# Patient Record
Sex: Female | Born: 1945 | Race: White | Hispanic: No | Smoking: Current every day smoker
Health system: Southern US, Community
[De-identification: ages and names within clinical notes are randomized; demographics above are authoritative.]

## PROBLEM LIST (undated history)

## (undated) DIAGNOSIS — F419 Anxiety disorder, unspecified: Secondary | ICD-10-CM

## (undated) HISTORY — DX: Anxiety disorder, unspecified: F41.9

## (undated) HISTORY — PX: APPENDECTOMY: SHX54

---

## 2012-02-16 ENCOUNTER — Ambulatory Visit (INDEPENDENT_AMBULATORY_CARE_PROVIDER_SITE_OTHER): Payer: BC Managed Care – PPO | Admitting: Family Medicine

## 2012-02-16 VITALS — BP 154/85 | HR 111 | Temp 97.9°F | Resp 18 | Ht 63.5 in | Wt 129.0 lb

## 2012-02-16 DIAGNOSIS — F419 Anxiety disorder, unspecified: Secondary | ICD-10-CM

## 2012-02-16 DIAGNOSIS — J069 Acute upper respiratory infection, unspecified: Secondary | ICD-10-CM

## 2012-02-16 DIAGNOSIS — F411 Generalized anxiety disorder: Secondary | ICD-10-CM

## 2012-02-16 MED ORDER — MUCINEX DM 30-600 MG PO TB12: 1.0000 | ORAL_TABLET | Freq: Two times a day (BID) | ORAL | Status: DC

## 2012-02-16 MED ORDER — PAROXETINE HCL 40 MG PO TABS: 40.0000 mg | ORAL_TABLET | ORAL | Status: DC

## 2012-02-16 MED ORDER — AMOXICILLIN-POT CLAVULANATE 875-125 MG PO TABS: 1.0000 | ORAL_TABLET | Freq: Two times a day (BID) | ORAL | Status: DC

## 2012-02-16 MED ORDER — IPRATROPIUM BROMIDE 0.03 % NA SOLN: 2.0000 | Freq: Two times a day (BID) | NASAL | Status: DC

## 2012-02-16 NOTE — Progress Notes (Signed)
[address]   [place], [place]  [address]   [id number]  Subjective:    Patient ID: Holly Farmer, female    DOB: 28-Oct-1945, 67 y.o.   MRN: [id number]  HPIThis 67 y.o. female presents for evaluation of the following:    1.  Cold symptoms: onset four days ago.  Spent 14 hours in Emergency Department at [facility].  No fever; +fever blister.  +nasal congestion.  Hoarseness.  No chills/sweats. No headache.  No ear pain; scratchy throat mild sore throat. S/p flu vaccine.  +coughing; mild sputum production; +PND: +rhinorrhea.  No v/d.  No body aches.  Took Aleve this morning.  +tobacco abuse sporadic.    2. Anxiety: needs refill on Paxil.  Just moved 73 year old mother from [place].  Taking for two years; life is stressful.  Planned wedding for daughter in July 2013.  Ran out of Paxil one month ago.  Dizzy for a while.  Sleeping well.  No SI/HI.  Daughter is therapist; no therapist formally.    PCP:  None; previously treated by Dr. Yetta Barre; last visit four months ago.  Looking for new PCP.     Review of Systems  Constitutional: Negative for fever, chills, diaphoresis and fatigue.  HENT: Positive for congestion, rhinorrhea, voice change and postnasal drip. Negative for ear pain, sore throat, trouble swallowing and sinus pressure.   Respiratory: Positive for cough. Negative for shortness of breath and wheezing.   Gastrointestinal: Negative for nausea, vomiting and diarrhea.  Skin: Negative for rash.  Psychiatric/Behavioral: Negative for suicidal ideas, sleep disturbance, self-injury and dysphoric mood. The patient is nervous/anxious.         Past Medical History  Diagnosis Date  . Anxiety   . Stress     Past Surgical History  Procedure Date  . Appendectomy     Prior to Admission medications   Medication Sig Start Date End Date Taking? Authorizing Provider  amoxicillin-clavulanate (AUGMENTIN) 875-125 MG per tablet Take 1 tablet by mouth 2 (two) times daily. 02/16/12   [name],  MD  Dextromethorphan-Guaifenesin (MUCINEX DM) 30-600 MG TB12 Take 1 tablet by mouth 2 (two) times daily. 02/16/12   [name], MD  ipratropium (ATROVENT) 0.03 % nasal spray Place 2 sprays into the nose 2 (two) times daily. 02/16/12   [name], MD  PARoxetine (PAXIL) 40 MG tablet Take 1 tablet (40 mg total) by mouth every morning. 02/16/12   [name], MD    No Known Allergies  History   Social History  . Marital Status: Unknown    Spouse Name: N/A    Number of Children: N/A  . Years of Education: N/A   Occupational History  . Not on file.   Social History Main Topics  . Smoking status: Current Every Day Smoker  . Smokeless tobacco: Not on file  . Alcohol Use: Yes  . Drug Use: Not on file  . Sexually Active: Not on file   Other Topics Concern  . Not on file   Social History Narrative   Marital status:  Married   Children: 2 children; no grandchildren.   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   Tobacco: daily smoker    Alcohol: socially.; once per month   Exercise: no formal exercise.    Family History  Problem Relation Age of Onset  . Atrial fibrillation Mother   . Arthritis Mother     Objective:   Physical Exam  Nursing  note and vitals reviewed. Constitutional: She is oriented to person, place, and time. She appears well-developed and well-nourished. No distress.  HENT:  Head: Normocephalic and atraumatic.  Left Ear: External ear normal.  Nose: Rhinorrhea present. Right sinus exhibits no maxillary sinus tenderness and no frontal sinus tenderness. Left sinus exhibits no maxillary sinus tenderness and no frontal sinus tenderness.  Mouth/Throat: Posterior oropharyngeal erythema present. No oropharyngeal exudate or posterior oropharyngeal edema.  Eyes: Conjunctivae normal are normal. Pupils are equal, round, and reactive to light.  Neck: Normal range of motion. Neck supple.  Cardiovascular: Normal rate and regular rhythm.   Pulmonary/Chest:  Breath sounds normal.  Lymphadenopathy:    She has cervical adenopathy.  Neurological: She is alert and oriented to person, place, and time. No cranial nerve deficit. She exhibits normal muscle tone. Coordination normal.  Skin: She is not diaphoretic.  Psychiatric: Her behavior is normal. Judgment and thought content normal. Her mood appears anxious.       Assessment & Plan:   1. Acute upper respiratory infections of unspecified site  Dextromethorphan-Guaifenesin (MUCINEX DM) 30-600 MG TB12, ipratropium (ATROVENT) 0.03 % nasal spray, amoxicillin-clavulanate (AUGMENTIN) 875-125 MG per tablet  2. Anxiety  PARoxetine (PAXIL) 40 MG tablet     1. URI:   New.  Rx for Mucinex DM bid; rx for Atrovent nasal spray. If no improvement in 3-5 days, start Augmentin bid. 2.  Anxiety:  New to this provider; chronic per patient; refill of Paxil 40mg  daily.  Recommend follow-up in six months.  Meds ordered this encounter  Medications  . DISCONTD: PARoxetine (PAXIL) 40 MG tablet    Sig: Take 40 mg by mouth every morning.  Marland Kitchen PARoxetine (PAXIL) 40 MG tablet    Sig: Take 1 tablet (40 mg total) by mouth every morning.    Dispense:  30 tablet    Refill:  5  . Dextromethorphan-Guaifenesin (MUCINEX DM) 30-600 MG TB12    Sig: Take 1 tablet by mouth 2 (two) times daily.    Dispense:  28 each    Refill:  0  . ipratropium (ATROVENT) 0.03 % nasal spray    Sig: Place 2 sprays into the nose 2 (two) times daily.    Dispense:  30 mL    Refill:  5  . amoxicillin-clavulanate (AUGMENTIN) 875-125 MG per tablet    Sig: Take 1 tablet by mouth 2 (two) times daily.    Dispense:  20 tablet    Refill:  0

## 2012-02-16 NOTE — Patient Instructions (Addendum)
1. Acute upper respiratory infections of unspecified site  Dextromethorphan-Guaifenesin (MUCINEX DM) 30-600 MG TB12, ipratropium (ATROVENT) 0.03 % nasal spray, amoxicillin-clavulanate (AUGMENTIN) 875-125 MG per tablet  2. Anxiety  PARoxetine (PAXIL) 40 MG tablet     1. START MUCINEX DM AND ATROVENT NASAL SPRAY; IF NO IMPROVEMENT IN 3-5 DAYS, START AUGMENTIN FOR SINUS INFECTION.

## 2012-09-06 ENCOUNTER — Other Ambulatory Visit: Payer: Self-pay | Admitting: Family Medicine

## 2012-10-20 ENCOUNTER — Other Ambulatory Visit: Payer: Self-pay | Admitting: Physician Assistant

## 2012-10-20 ENCOUNTER — Other Ambulatory Visit: Payer: Self-pay | Admitting: Family Medicine

## 2012-10-20 NOTE — Telephone Encounter (Signed)
PT IS SCHEDULED TO SEE DR Katrinka Blazing ON 10/24/12, BUT IS REQUESTING to get 4 pills until her appt

## 2012-10-24 ENCOUNTER — Ambulatory Visit (INDEPENDENT_AMBULATORY_CARE_PROVIDER_SITE_OTHER): Payer: BC Managed Care – PPO | Admitting: Family Medicine

## 2012-10-24 ENCOUNTER — Encounter: Payer: Self-pay | Admitting: Family Medicine

## 2012-10-24 VITALS — BP 134/78 | HR 87 | Temp 98.1°F | Resp 16 | Ht 63.5 in | Wt 129.2 lb

## 2012-10-24 DIAGNOSIS — F411 Generalized anxiety disorder: Secondary | ICD-10-CM

## 2012-10-24 MED ORDER — PAROXETINE HCL 40 MG PO TABS: 40.0000 mg | ORAL_TABLET | ORAL | Status: DC

## 2012-10-24 NOTE — Progress Notes (Signed)
Subjective:    Patient ID: Holly Farmer, female    DOB: 12-Aug-1945, 67 y.o.   MRN: [id number]  HPI Last CPE- unknown Colonoscopy- never, not interested Mammo/pap- not regular Flu- every year at [facility], discussed, will think about it  Doing the same. Mom moved here, going better than she thought it would. A lot of work. Mother lives in an apartment, but wants to move in with her.  Emotionally handling everything well. Needs a vacation. Family and work keeping her busy. Paxil takes edge off anxiety. Current dose working well. Ran out of paxil- felt dizzy. On Paxil 3-4 years. Thinks she is on a good level. No major mood swings.  Goes to [employer] [facility] for kayaking every month. Works hard for 3 weeks to have a week off.  Sleeps very well.   Married 36 years, happily married, no abuse. Daughter and son, no grandchildren.  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  Smokes less than ppd. ETOH- 1x week Drugs- no Exercise- kayaking, moving furniture.Occasional headache relieved with Aleve. Appendectomy age 86  Mother- 81, takes Paxil Father- died in plane crash when she was young No siblings   Review of Systems  Constitutional: Negative for fever, chills, diaphoresis and fatigue.  Neurological: Negative for dizziness, tremors, speech difficulty, light-headedness, numbness and headaches.  Psychiatric/Behavioral: Negative for suicidal ideas, sleep disturbance, self-injury, dysphoric mood and decreased concentration. The patient is nervous/anxious.    Past Medical History  Diagnosis Date  . Anxiety    Past Surgical History  Procedure Laterality Date  . Appendectomy     No Known Allergies Current Outpatient Prescriptions on File Prior to Visit  Medication Sig Dispense Refill  . amoxicillin-clavulanate (AUGMENTIN) 875-125 MG per tablet Take 1 tablet by mouth 2 (two) times daily.  20 tablet  0  . Dextromethorphan-Guaifenesin (MUCINEX DM) 30-600 MG TB12 Take 1  tablet by mouth 2 (two) times daily.  28 each  0  . ipratropium (ATROVENT) 0.03 % nasal spray Place 2 sprays into the nose 2 (two) times daily.  30 mL  5   No current facility-administered medications on file prior to visit.   History   Social History  . Marital Status: Unknown    Spouse Name: N/A    Number of Children: 2  . Years of Education: N/A   Occupational History  . 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 Smoking status: Current Every Day Smoker  . Smokeless tobacco: Never Used  . Alcohol Use: 1.2 oz/week    2 Glasses of wine per week  . Drug Use: No  . Sexual Activity: Yes   Other Topics Concern  . Not on file   Social History Narrative   Marital status:  Marriedx 36 years. Happily married; no abuse.      Children: 2 children; no grandchildren.      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      Tobacco: daily smoker less than 1ppd.       Alcohol: socially.; once per month      Drugs: none      Exercise: no formal exercise.  Kayaking once per month; moves furniture a lot at work.            Family History  Problem Relation Age of Onset  . Atrial fibrillation Mother   . Arthritis Mother        Objective:  Physical Exam  Nursing note and vitals reviewed. Constitutional: She is oriented to person, place, and time. She appears well-developed and well-nourished. No distress.  HENT:  Head: Normocephalic and atraumatic.  Eyes: Conjunctivae are normal. Pupils are equal, round, and reactive to light.  Neck: Normal range of motion. Neck supple.  Cardiovascular: Normal rate, regular rhythm and normal heart sounds.  Exam reveals no gallop and no friction rub.   No murmur heard. Pulmonary/Chest: Effort normal and breath sounds normal. She has no wheezes. She has no rales.  Neurological: She is alert and oriented to person, place, and time.  Skin: She is not diaphoretic.  Psychiatric: She has a normal  mood and affect. Her behavior is normal.      Assessment & Plan:  Generalized anxiety disorder  1. Generalized Anxiety D/o: controlled; refill of Paxil provided.  Follow-up six months. 2.  Health Maintenance: pt declined appointment for CPE with pap smear, mammogram, colonoscopy, immunizations, etc.  Counseled on current guidelines for preventative health.  Meds ordered this encounter  Medications  . PARoxetine (PAXIL) 40 MG tablet    Sig: Take 1 tablet (40 mg total) by mouth every morning.    Dispense:  30 tablet    Refill:  5   Nilda Simmer, M.D.  [facility] [address] [place], [place]  [address] [phone] phone [phone] fax

## 2012-12-05 ENCOUNTER — Encounter: Payer: Self-pay | Admitting: Family Medicine

## 2013-04-05 ENCOUNTER — Encounter: Payer: Self-pay | Admitting: Family Medicine

## 2013-05-14 ENCOUNTER — Other Ambulatory Visit: Payer: Self-pay | Admitting: Family Medicine

## 2013-07-25 ENCOUNTER — Telehealth: Payer: Self-pay

## 2013-07-25 MED ORDER — PAROXETINE HCL 40 MG PO TABS: ORAL_TABLET | ORAL | Status: DC

## 2013-07-25 NOTE — Telephone Encounter (Signed)
Pt.notified

## 2013-07-25 NOTE — Telephone Encounter (Signed)
Pended the order, patient due for visit. Has appt scheduled for later this month.

## 2013-07-25 NOTE — Telephone Encounter (Signed)
PARoxetine (PAXIL) 40 MG tablet [[id number]]  Patient has schedule a visit with Dr. Katrinka BlazingSmith on 08/13/2013 for medication refill.   She is out of medicine as of 07/25/2013 and gets dizzy when not taken.  Pt wanted to know if medication can be called in until she has her appt on 08/13/2013. Phone - [phone]

## 2013-07-25 NOTE — Telephone Encounter (Signed)
Rx for Paxil sent to pharmacy.  Thanks.

## 2013-08-13 ENCOUNTER — Ambulatory Visit: Payer: BC Managed Care – PPO | Admitting: Family Medicine

## 2013-08-22 ENCOUNTER — Encounter: Payer: Self-pay | Admitting: Family Medicine

## 2013-08-22 ENCOUNTER — Ambulatory Visit (INDEPENDENT_AMBULATORY_CARE_PROVIDER_SITE_OTHER): Payer: BC Managed Care – PPO | Admitting: Family Medicine

## 2013-08-22 VITALS — BP 110/66 | HR 86 | Temp 97.9°F | Resp 16 | Ht 63.25 in | Wt 125.6 lb

## 2013-08-22 DIAGNOSIS — F411 Generalized anxiety disorder: Secondary | ICD-10-CM

## 2013-08-22 MED ORDER — PAROXETINE HCL 40 MG PO TABS: ORAL_TABLET | ORAL | Status: DC

## 2013-08-22 NOTE — Progress Notes (Signed)
Subjective:    Patient ID: Holly Farmer, female    DOB: 1945-06-04, 68 y.o.   MRN: [id number]  08/22/2013  Medication Refill and Anxiety   HPI This 68 y.o. female presents for evaluation of anxiety.  Last visit 10/24/12; no changes to management made at last visit.  Patient reports good compliance with medication, good tolerance to medication, and good symptom control.   Jose daughter is expected; breech; may need cesarean. Due September 18, 2013.  Looking forward to delivery.   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  Has suffered with anxiety; life is very busy and thus very stressful managing various aspects of life including business, family, marriage, etc.  Still has 68 year old mother.  Very expensive to maintain her in nursing home; needs to get her on Medicaid.   Missing 68 year old son showed up.  Now reestablishing a relationship with him.   Everything is good.  Elita QuickJose is psychologist.    Found two granddaughters who she did not know about; has a daughter-in-law.   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     Review of Systems  Constitutional: Negative for fever, chills, diaphoresis and fatigue.  Neurological: Negative for dizziness, tremors, seizures, syncope, facial asymmetry, speech difficulty, weakness, light-headedness, numbness and headaches.  Psychiatric/Behavioral: Negative for suicidal ideas, sleep disturbance, self-injury and dysphoric mood. The patient is nervous/anxious.     Past Medical History  Diagnosis Date  . Anxiety    Past Surgical History  Procedure Laterality Date  . Appendectomy     No Known Allergies Current Outpatient Prescriptions  Medication Sig Dispense Refill  . PARoxetine (PAXIL) 40 MG tablet 1 tab daily  30 tablet  5   No current facility-administered medications for this visit.   History   Social History  . Marital Status: Unknown    Spouse Name: N/A    Number of Children: 2  . Years of Education: N/A    Occupational History  . 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 Smoking status: Current Every Day Smoker  . Smokeless tobacco: Never Used  . Alcohol Use: 1.2 oz/week    2 Glasses of wine per week  . Drug Use: No  . Sexual Activity: Yes   Other Topics Concern  . Not on file   Social History Narrative   Marital status:  Marriedx 36 years. Happily married; no abuse.      Children: 2 children; no grandchildren.      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      Tobacco: daily smoker less than 1ppd.       Alcohol: socially.; once per month      Drugs: none      Exercise: no formal exercise.  Kayaking once per month; moves furniture a lot at work.            Family History  Problem Relation Age of Onset  . Atrial fibrillation Mother   . Arthritis Mother        Objective:    BP 110/66  Pulse 86  Temp(Src) 97.9 F (36.6 C) (Oral)  Resp 16  Ht 5' 3.25" (1.607 m)  Wt 125 lb 9.6 oz (56.972 kg)  BMI 22.06 kg/m2  SpO2 96% Physical Exam  Nursing note and vitals reviewed. Constitutional: She is oriented to person, place, and time. She appears well-developed and well-nourished. No distress.  HENT:  Head: Normocephalic and atraumatic.  Eyes: Conjunctivae are normal. Pupils are equal, round, and reactive to light.  Neck: Normal range of motion. Neck supple. No thyromegaly present.  Cardiovascular: Normal rate, regular rhythm and normal heart sounds.  Exam reveals no gallop and no friction rub.   No murmur heard. Pulmonary/Chest: Effort normal and breath sounds normal. She has no wheezes. She has no rales.  Neurological: She is alert and oriented to person, place, and time. No cranial nerve deficit. She exhibits normal muscle tone. Coordination normal.  Skin: She is not diaphoretic.  Psychiatric: She has a normal mood and affect. Her behavior is normal. Judgment and thought content normal.   No results found for  this or any previous visit.     Assessment & Plan:   1. Generalized anxiety disorder    1. Generalized anxiety disorder: controlled; refill of Paxil 40mg  daily provided.  RTC six months.  Meds ordered this encounter  Medications  . PARoxetine (PAXIL) 40 MG tablet    Sig: 1 tab daily    Dispense:  30 tablet    Refill:  5    CYCLE FILL MEDICATION. Authorization is required for next refill.    Return in about 6 months (around 02/22/2014) for recheck anxiety.    Nilda Simmer, M.D.  [facility] [address] [place], [place]  [address] [phone] phone [phone] fax

## 2013-08-25 ENCOUNTER — Other Ambulatory Visit: Payer: Self-pay | Admitting: Family Medicine

## 2013-09-18 ENCOUNTER — Telehealth: Payer: Self-pay | Admitting: Family Medicine

## 2013-09-18 NOTE — Telephone Encounter (Signed)
Patient is asking for a script to help her cope with sciatica pain.   [phone]

## 2013-09-18 NOTE — Telephone Encounter (Signed)
Advised pt needs to RTC for evaluation of back pain. She states she will try to come in today or to make an appt. She will call back to schedule appt.

## 2013-09-24 ENCOUNTER — Telehealth: Payer: Self-pay

## 2013-09-24 NOTE — Telephone Encounter (Signed)
Pt has scheduled an appt with dr Katrinka Blazing on 10/03/13, but she has a chiro appt tomorrow, pt wants to talk with dr Katrinka Blazing regarding the chiro appt she has tomorrow and about a referral. i explained twice to patient that typically dr will need to evaluate in order to make a referral. Pt insisted on having the dr to call her anyway

## 2013-09-25 NOTE — Telephone Encounter (Signed)
Spoke to pt- she cancelled appt for today and will follow up with Dr. Katrinka Blazing on 10/03/13. Advised pt to contact insurance company to determine which Chiro's are in her network. Pt call was disconnected. Tried to ring back and busy.

## 2013-10-03 ENCOUNTER — Encounter: Payer: Self-pay | Admitting: Family Medicine

## 2013-10-03 ENCOUNTER — Ambulatory Visit (INDEPENDENT_AMBULATORY_CARE_PROVIDER_SITE_OTHER): Payer: BC Managed Care – PPO

## 2013-10-03 ENCOUNTER — Ambulatory Visit (INDEPENDENT_AMBULATORY_CARE_PROVIDER_SITE_OTHER): Payer: BC Managed Care – PPO | Admitting: Family Medicine

## 2013-10-03 VITALS — BP 146/88 | HR 94 | Temp 97.8°F | Resp 16 | Ht 63.5 in | Wt 126.8 lb

## 2013-10-03 DIAGNOSIS — M5431 Sciatica, right side: Secondary | ICD-10-CM

## 2013-10-03 DIAGNOSIS — M5430 Sciatica, unspecified side: Secondary | ICD-10-CM

## 2013-10-03 DIAGNOSIS — Z23 Encounter for immunization: Secondary | ICD-10-CM

## 2013-10-03 MED ORDER — MELOXICAM 15 MG PO TABS: 15.0000 mg | ORAL_TABLET | Freq: Every day | ORAL | Status: DC

## 2013-10-03 MED ORDER — METHOCARBAMOL 500 MG PO TABS
500.0000 mg | ORAL_TABLET | Freq: Every day | ORAL | Status: DC
Start: 2013-10-03 — End: 2013-10-22

## 2013-10-03 NOTE — Patient Instructions (Signed)
Sciatica with Rehab The sciatic nerve runs from the back down the leg and is responsible for sensation and control of the muscles in the back (posterior) side of the thigh, lower leg, and foot. Sciatica is a condition that is characterized by inflammation of this nerve.  SYMPTOMS   Signs of nerve damage, including numbness and/or weakness along the posterior side of the lower extremity.  Pain in the back of the thigh that may also travel down the leg.  Pain that worsens when sitting for long periods of time.  Occasionally, pain in the back or buttock. CAUSES  Inflammation of the sciatic nerve is the cause of sciatica. The inflammation is due to something irritating the nerve. Common sources of irritation include:  Sitting for long periods of time.  Direct trauma to the nerve.  Arthritis of the spine.  Herniated or ruptured disk.  Slipping of the vertebrae (spondylolisthesis).  Pressure from soft tissues, such as muscles or ligament-like tissue (fascia). RISK INCREASES WITH:  Sports that place pressure or stress on the spine (football or weightlifting).  Poor strength and flexibility.  Failure to warm up properly before activity.  Family history of low back pain or disk disorders.  Previous back injury or surgery.  Poor body mechanics, especially when lifting, or poor posture. PREVENTION   Warm up and stretch properly before activity.  Maintain physical fitness:  Strength, flexibility, and endurance.  Cardiovascular fitness.  Learn and use proper technique, especially with posture and lifting. When possible, have coach correct improper technique.  Avoid activities that place stress on the spine. PROGNOSIS If treated properly, then sciatica usually resolves within 6 weeks. However, occasionally surgery is necessary.  RELATED COMPLICATIONS   Permanent nerve damage, including pain, numbness, tingle, or weakness.  Chronic back pain.  Risks of surgery: infection,  bleeding, nerve damage, or damage to surrounding tissues. TREATMENT Treatment initially involves resting from any activities that aggravate your symptoms. The use of ice and medication may help reduce pain and inflammation. The use of strengthening and stretching exercises may help reduce pain with activity. These exercises may be performed at home or with referral to a therapist. A therapist may recommend further treatments, such as transcutaneous electronic nerve stimulation (TENS) or ultrasound. Your caregiver may recommend corticosteroid injections to help reduce inflammation of the sciatic nerve. If symptoms persist despite non-surgical (conservative) treatment, then surgery may be recommended. MEDICATION  If pain medication is necessary, then nonsteroidal anti-inflammatory medications, such as aspirin and ibuprofen, or other minor pain relievers, such as acetaminophen, are often recommended.  Do not take pain medication for 7 days before surgery.  Prescription pain relievers may be given if deemed necessary by your caregiver. Use only as directed and only as much as you need.  Ointments applied to the skin may be helpful.  Corticosteroid injections may be given by your caregiver. These injections should be reserved for the most serious cases, because they may only be given a certain number of times. HEAT AND COLD  Cold treatment (icing) relieves pain and reduces inflammation. Cold treatment should be applied for 10 to 15 minutes every 2 to 3 hours for inflammation and pain and immediately after any activity that aggravates your symptoms. Use ice packs or massage the area with a piece of ice (ice massage).  Heat treatment may be used prior to performing the stretching and strengthening activities prescribed by your caregiver, physical therapist, or athletic trainer. Use a heat pack or soak the injury in warm water.   SEEK MEDICAL CARE IF:  Treatment seems to offer no benefit, or the condition  worsens.  Any medications produce adverse side effects. EXERCISES  RANGE OF MOTION (ROM) AND STRETCHING EXERCISES - Sciatica Most people with sciatic will find that their symptoms worsen with either excessive bending forward (flexion) or arching at the low back (extension). The exercises which will help resolve your symptoms will focus on the opposite motion. Your physician, physical therapist or athletic trainer will help you determine which exercises will be most helpful to resolve your low back pain. Do not complete any exercises without first consulting with your clinician. Discontinue any exercises which worsen your symptoms until you speak to your clinician. If you have pain, numbness or tingling which travels down into your buttocks, leg or foot, the goal of the therapy is for these symptoms to move closer to your back and eventually resolve. Occasionally, these leg symptoms will get better, but your low back pain may worsen; this is typically an indication of progress in your rehabilitation. Be certain to be very alert to any changes in your symptoms and the activities in which you participated in the 24 hours prior to the change. Sharing this information with your clinician will allow him/her to most efficiently treat your condition. These exercises may help you when beginning to rehabilitate your injury. Your symptoms may resolve with or without further involvement from your physician, physical therapist or athletic trainer. While completing these exercises, remember:   Restoring tissue flexibility helps normal motion to return to the joints. This allows healthier, less painful movement and activity.  An effective stretch should be held for at least 30 seconds.  A stretch should never be painful. You should only feel a gentle lengthening or release in the stretched tissue. FLEXION RANGE OF MOTION AND STRETCHING EXERCISES: STRETCH - Flexion, Single Knee to Chest   Lie on a firm bed or floor  with both legs extended in front of you.  Keeping one leg in contact with the floor, bring your opposite knee to your chest. Hold your leg in place by either grabbing behind your thigh or at your knee.  Pull until you feel a gentle stretch in your low back. Hold __________ seconds.  Slowly release your grasp and repeat the exercise with the opposite side. Repeat __________ times. Complete this exercise __________ times per day.  STRETCH - Flexion, Double Knee to Chest  Lie on a firm bed or floor with both legs extended in front of you.  Keeping one leg in contact with the floor, bring your opposite knee to your chest.  Tense your stomach muscles to support your back and then lift your other knee to your chest. Hold your legs in place by either grabbing behind your thighs or at your knees.  Pull both knees toward your chest until you feel a gentle stretch in your low back. Hold __________ seconds.  Tense your stomach muscles and slowly return one leg at a time to the floor. Repeat __________ times. Complete this exercise __________ times per day.  STRETCH - Low Trunk Rotation   Lie on a firm bed or floor. Keeping your legs in front of you, bend your knees so they are both pointed toward the ceiling and your feet are flat on the floor.  Extend your arms out to the side. This will stabilize your upper body by keeping your shoulders in contact with the floor.  Gently and slowly drop both knees together to one side until   you feel a gentle stretch in your low back. Hold for __________ seconds.  Tense your stomach muscles to support your low back as you bring your knees back to the starting position. Repeat the exercise to the other side. Repeat __________ times. Complete this exercise __________ times per day  EXTENSION RANGE OF MOTION AND FLEXIBILITY EXERCISES: STRETCH - Extension, Prone on Elbows  Lie on your stomach on the floor, a bed will be too soft. Place your palms about shoulder  width apart and at the height of your head.  Place your elbows under your shoulders. If this is too painful, stack pillows under your chest.  Allow your body to relax so that your hips drop lower and make contact more completely with the floor.  Hold this position for __________ seconds.  Slowly return to lying flat on the floor. Repeat __________ times. Complete this exercise __________ times per day.  RANGE OF MOTION - Extension, Prone Press Ups  Lie on your stomach on the floor, a bed will be too soft. Place your palms about shoulder width apart and at the height of your head.  Keeping your back as relaxed as possible, slowly straighten your elbows while keeping your hips on the floor. You may adjust the placement of your hands to maximize your comfort. As you gain motion, your hands will come more underneath your shoulders.  Hold this position __________ seconds.  Slowly return to lying flat on the floor. Repeat __________ times. Complete this exercise __________ times per day.  STRENGTHENING EXERCISES - Sciatica  These exercises may help you when beginning to rehabilitate your injury. These exercises should be done near your "sweet spot." This is the neutral, low-back arch, somewhere between fully rounded and fully arched, that is your least painful position. When performed in this safe range of motion, these exercises can be used for people who have either a flexion or extension based injury. These exercises may resolve your symptoms with or without further involvement from your physician, physical therapist or athletic trainer. While completing these exercises, remember:   Muscles can gain both the endurance and the strength needed for everyday activities through controlled exercises.  Complete these exercises as instructed by your physician, physical therapist or athletic trainer. Progress with the resistance and repetition exercises only as your caregiver advises.  You may  experience muscle soreness or fatigue, but the pain or discomfort you are trying to eliminate should never worsen during these exercises. If this pain does worsen, stop and make certain you are following the directions exactly. If the pain is still present after adjustments, discontinue the exercise until you can discuss the trouble with your clinician. STRENGTHENING - Deep Abdominals, Pelvic Tilt   Lie on a firm bed or floor. Keeping your legs in front of you, bend your knees so they are both pointed toward the ceiling and your feet are flat on the floor.  Tense your lower abdominal muscles to press your low back into the floor. This motion will rotate your pelvis so that your tail bone is scooping upwards rather than pointing at your feet or into the floor.  With a gentle tension and even breathing, hold this position for __________ seconds. Repeat __________ times. Complete this exercise __________ times per day.  STRENGTHENING - Abdominals, Crunches   Lie on a firm bed or floor. Keeping your legs in front of you, bend your knees so they are both pointed toward the ceiling and your feet are flat on the   floor. Cross your arms over your chest.  Slightly tip your chin down without bending your neck.  Tense your abdominals and slowly lift your trunk high enough to just clear your shoulder blades. Lifting higher can put excessive stress on the low back and does not further strengthen your abdominal muscles.  Control your return to the starting position. Repeat __________ times. Complete this exercise __________ times per day.  STRENGTHENING - Quadruped, Opposite UE/LE Lift  Assume a hands and knees position on a firm surface. Keep your hands under your shoulders and your knees under your hips. You may place padding under your knees for comfort.  Find your neutral spine and gently tense your abdominal muscles so that you can maintain this position. Your shoulders and hips should form a rectangle  that is parallel with the floor and is not twisted.  Keeping your trunk steady, lift your right hand no higher than your shoulder and then your left leg no higher than your hip. Make sure you are not holding your breath. Hold this position __________ seconds.  Continuing to keep your abdominal muscles tense and your back steady, slowly return to your starting position. Repeat with the opposite arm and leg. Repeat __________ times. Complete this exercise __________ times per day.  STRENGTHENING - Abdominals and Quadriceps, Straight Leg Raise   Lie on a firm bed or floor with both legs extended in front of you.  Keeping one leg in contact with the floor, bend the other knee so that your foot can rest flat on the floor.  Find your neutral spine, and tense your abdominal muscles to maintain your spinal position throughout the exercise.  Slowly lift your straight leg off the floor about 6 inches for a count of 15, making sure to not hold your breath.  Still keeping your neutral spine, slowly lower your leg all the way to the floor. Repeat this exercise with each leg __________ times. Complete this exercise __________ times per day. POSTURE AND BODY MECHANICS CONSIDERATIONS - Sciatica Keeping correct posture when sitting, standing or completing your activities will reduce the stress put on different body tissues, allowing injured tissues a chance to heal and limiting painful experiences. The following are general guidelines for improved posture. Your physician or physical therapist will provide you with any instructions specific to your needs. While reading these guidelines, remember:  The exercises prescribed by your provider will help you have the flexibility and strength to maintain correct postures.  The correct posture provides the optimal environment for your joints to work. All of your joints have less wear and tear when properly supported by a spine with good posture. This means you will  experience a healthier, less painful body.  Correct posture must be practiced with all of your activities, especially prolonged sitting and standing. Correct posture is as important when doing repetitive low-stress activities (typing) as it is when doing a single heavy-load activity (lifting). RESTING POSITIONS Consider which positions are most painful for you when choosing a resting position. If you have pain with flexion-based activities (sitting, bending, stooping, squatting), choose a position that allows you to rest in a less flexed posture. You would want to avoid curling into a fetal position on your side. If your pain worsens with extension-based activities (prolonged standing, working overhead), avoid resting in an extended position such as sleeping on your stomach. Most people will find more comfort when they rest with their spine in a more neutral position, neither too rounded nor too   arched. Lying on a non-sagging bed on your side with a pillow between your knees, or on your back with a pillow under your knees will often provide some relief. Keep in mind, being in any one position for a prolonged period of time, no matter how correct your posture, can still lead to stiffness. PROPER SITTING POSTURE In order to minimize stress and discomfort on your spine, you must sit with correct posture Sitting with good posture should be effortless for a healthy body. Returning to good posture is a gradual process. Many people can work toward this most comfortably by using various supports until they have the flexibility and strength to maintain this posture on their own. When sitting with proper posture, your ears will fall over your shoulders and your shoulders will fall over your hips. You should use the back of the chair to support your upper back. Your low back will be in a neutral position, just slightly arched. You may place a small pillow or folded towel at the base of your low back for support.  When  working at a desk, create an environment that supports good, upright posture. Without extra support, muscles fatigue and lead to excessive strain on joints and other tissues. Keep these recommendations in mind: CHAIR:   A chair should be able to slide under your desk when your back makes contact with the back of the chair. This allows you to work closely.  The chair's height should allow your eyes to be level with the upper part of your monitor and your hands to be slightly lower than your elbows. BODY POSITION  Your feet should make contact with the floor. If this is not possible, use a foot rest.  Keep your ears over your shoulders. This will reduce stress on your neck and low back. INCORRECT SITTING POSTURES   If you are feeling tired and unable to assume a healthy sitting posture, do not slouch or slump. This puts excessive strain on your back tissues, causing more damage and pain. Healthier options include:  Using more support, like a lumbar pillow.  Switching tasks to something that requires you to be upright or walking.  Talking a brief walk.  Lying down to rest in a neutral-spine position. PROLONGED STANDING WHILE SLIGHTLY LEANING FORWARD  When completing a task that requires you to lean forward while standing in one place for a long time, place either foot up on a stationary 2-4 inch high object to help maintain the best posture. When both feet are on the ground, the low back tends to lose its slight inward curve. If this curve flattens (or becomes too large), then the back and your other joints will experience too much stress, fatigue more quickly and can cause pain.  CORRECT STANDING POSTURES Proper standing posture should be assumed with all daily activities, even if they only take a few moments, like when brushing your teeth. As in sitting, your ears should fall over your shoulders and your shoulders should fall over your hips. You should keep a slight tension in your abdominal  muscles to brace your spine. Your tailbone should point down to the ground, not behind your body, resulting in an over-extended swayback posture.  INCORRECT STANDING POSTURES  Common incorrect standing postures include a forward head, locked knees and/or an excessive swayback. WALKING Walk with an upright posture. Your ears, shoulders and hips should all line-up. PROLONGED ACTIVITY IN A FLEXED POSITION When completing a task that requires you to bend forward   at your waist or lean over a low surface, try to find a way to stabilize 3 of 4 of your limbs. You can place a hand or elbow on your thigh or rest a knee on the surface you are reaching across. This will provide you more stability so that your muscles do not fatigue as quickly. By keeping your knees relaxed, or slightly bent, you will also reduce stress across your low back. CORRECT LIFTING TECHNIQUES DO :   Assume a wide stance. This will provide you more stability and the opportunity to get as close as possible to the object which you are lifting.  Tense your abdominals to brace your spine; then bend at the knees and hips. Keeping your back locked in a neutral-spine position, lift using your leg muscles. Lift with your legs, keeping your back straight.  Test the weight of unknown objects before attempting to lift them.  Try to keep your elbows locked down at your sides in order get the best strength from your shoulders when carrying an object.  Always ask for help when lifting heavy or awkward objects. INCORRECT LIFTING TECHNIQUES DO NOT:   Lock your knees when lifting, even if it is a small object.  Bend and twist. Pivot at your feet or move your feet when needing to change directions.  Assume that you cannot safely pick up a paperclip without proper posture. Document Released: 01/11/2005 Document Revised: 05/28/2013 Document Reviewed: 04/25/2008 [employer] Patient Information 2015 [employer]. This information is not intended to  replace advice given to you by your health care provider. Make sure you discuss any questions you have with your health care provider.  

## 2013-10-03 NOTE — Progress Notes (Signed)
Subjective:    Patient ID: Holly Farmer, female    DOB: 04/25/1945, 68 y.o.   MRN: [id number]  This chart was scribed for [name], MD by Gwenevere Abbot, ED scribe. This patient was seen in room Room/bed 21 and the patient's care was started at 2:47 PM.  Chief Complaint  Patient presents with  . Hip Pain    BOTH - down the LEGS x 3 months    HPI  HPI Comments:  Holly Farmer is a 68 y.o. female who presents to [facility] with lower back pain, onset three months ago, with associated symptoms of pain that radiates to the ankles. Pt denies numbness, tingling, or burning. Pt denies saddle anesthesia. Pt denies urinary symptoms. Pt reports that she takes 2 Aleve a day, without relief. Pt denies pain wakes her at night. Pt reports that standing or walking for long periods of time exacerbates pain.   Immunizations:  Pt is advised of risks and benefits of pertussis and flu shot.  Recently, granddaughter was born; providing care to granddaughter frequently.  Review of Systems  Genitourinary: Negative for dysuria, urgency, frequency, hematuria and difficulty urinating.  Musculoskeletal: Positive for arthralgias, back pain and myalgias.  Allergic/Immunologic: Positive for environmental allergies.  Neurological: Negative for weakness and numbness.   Past Medical History  Diagnosis Date  . Anxiety    Past Surgical History  Procedure Laterality Date  . Appendectomy     No Known Allergies Outpatient Encounter Prescriptions as of 10/03/2013  Medication Sig  . PARoxetine (PAXIL) 40 MG tablet 1 tab daily  . meloxicam (MOBIC) 15 MG tablet Take 1 tablet (15 mg total) by mouth daily.  . methocarbamol (ROBAXIN) 500 MG tablet Take 1 tablet (500 mg total) by mouth at bedtime.      Objective:   Physical Exam  Nursing note and vitals reviewed. Constitutional: She is oriented to person, place, and time. She appears well-developed and well-nourished. No distress.  HENT:  Head: Normocephalic and  atraumatic.  Mouth/Throat: Oropharynx is clear and moist.  Eyes: Conjunctivae and EOM are normal. Pupils are equal, round, and reactive to light.  Neck: Normal range of motion. Neck supple.  Cardiovascular: Normal rate.   Pulmonary/Chest: Effort normal.  Musculoskeletal: Normal range of motion.       Right hip: Normal. She exhibits normal range of motion, normal strength, no tenderness and no swelling.       Left hip: Normal. She exhibits normal range of motion, normal strength, no tenderness, no bony tenderness and no swelling.       Lumbar back: She exhibits normal range of motion, no tenderness, no bony tenderness, no swelling, no pain and no spasm.  Bilateral hips are full ROM with no tenderness or pain. Full ROM of lumbar spine without limitation or pain. Toe and heel walking intact. Straight leg raises are negative.  Motor 5/5 BLE.  Marching intact.  Neurological: She is alert and oriented to person, place, and time. She has normal reflexes. She exhibits normal muscle tone.  Skin: Skin is warm and dry. She is not diaphoretic.  Psychiatric: She has a normal mood and affect. Her behavior is normal.   [facility] reading (PRIMARY) by  Dr. Katrinka Blazing.  LUMBAR SPINE: DEGENERATIVE CHANGES L5-S1  TDAP ADMINISTERED.    Assessment & Plan:  Right sided sciatica - Plan: DG Lumbar Spine Complete, meloxicam (MOBIC) 15 MG tablet, methocarbamol (ROBAXIN) 500 MG tablet  Need for Tdap vaccination - Plan: Tdap vaccine greater than  or equal to 7yo IM   1. R sided sciatica:  New. Rx for Mobic and Robaxin provided; home exercise program also provided to perform daily.  If no improvement in one month, call for ortho referral. 2.  S/p TDAP.  Pt to receive flu vaccine at pharmacy with mother.   Meds ordered this encounter  Medications  . meloxicam (MOBIC) 15 MG tablet    Sig: Take 1 tablet (15 mg total) by mouth daily.    Dispense:  30 tablet    Refill:  2  . methocarbamol (ROBAXIN) 500 MG tablet    Sig: Take  1 tablet (500 mg total) by mouth at bedtime.    Dispense:  30 tablet    Refill:  2   I personally performed the services described in this documentation, which was scribed in my presence.  The recorded information has been reviewed and is accurate.  Nilda Simmer, M.D.  [facility] [address] [place], [place]  [id number] [phone] phone [phone] fax

## 2013-10-22 ENCOUNTER — Telehealth: Payer: Self-pay

## 2013-10-22 DIAGNOSIS — M5431 Sciatica, right side: Secondary | ICD-10-CM

## 2013-10-22 DIAGNOSIS — M5430 Sciatica, unspecified side: Secondary | ICD-10-CM

## 2013-10-22 MED ORDER — TRAMADOL HCL 50 MG PO TABS: 50.0000 mg | ORAL_TABLET | Freq: Four times a day (QID) | ORAL | Status: DC | PRN

## 2013-10-22 MED ORDER — METHOCARBAMOL 750 MG PO TABS: 750.0000 mg | ORAL_TABLET | Freq: Three times a day (TID) | ORAL | Status: DC

## 2013-10-22 MED ORDER — PREDNISONE 20 MG PO TABS: ORAL_TABLET | ORAL | Status: DC

## 2013-10-22 NOTE — Telephone Encounter (Signed)
Pt called in stating she has called multiple times over the past week needing to get something fixed with her medication and the doctor isn't getting her messages and that she has had the drug store also call and no one will return their calls. I informed her that I would put in another message to Dr. Katrinka Blazing and that is all we can really do unless she wanted to come in and see a different provider but she stated she wasn't going to do that because it has only been a week. She states that the pain med are not working and she is in serve pain and unable to walk. She would like to be called back at [phone].

## 2013-10-22 NOTE — Telephone Encounter (Signed)
Call --- 1. I cannot increase the dose of Meloxicam; I prescribed her the maximum dose.  2. I sent in a higher dose of Robaxin.  3. If she is not improved, recommend referral to orthopedics. Is she agreeable to referral?

## 2013-10-22 NOTE — Telephone Encounter (Signed)
Call ---1.   I just received her message that was dated 10-22-13 at 3:48pm; I have sent in a higher dose of muscle relaxer.  2.  I have also sent in a new rx for Prednisone taper.  3.  Recommend  STOPPING Meloxicam since it has not been effective.  4.  I will also send in a prescription for Tramadol to the pharmacy which is just for pain.  She can take prednisone, robaxin, and Tramadol all together.   5. I have also placed a referral for ortho consultation since pain has worsened.

## 2013-10-22 NOTE — Telephone Encounter (Signed)
Patient states her muscle relaxer/pain medication has not helped relieve her discomfort and pain in her thoracic nerve on both sides. Patient requesting a stronger strength in both. [facility]. Patients call back number is [phone]

## 2013-10-23 NOTE — Telephone Encounter (Signed)
Call ---  I am happy for patient to see a chiropractor. She should not need referral; she can make appointment with a chiropractor of choice.

## 2013-10-23 NOTE — Telephone Encounter (Signed)
Spoke to pt- she has picked up the new script and so far is working better for her.  Pt states she wants to have a referral to a chiropractor instead of ortho. She DOES NOT want to go to ortho- she refuses sx at this point and this is the only option they offer her.

## 2013-10-23 NOTE — Telephone Encounter (Signed)
Spoke to pt. Advised she may make an appt for the chiropractor she is going to try to get into see them as soon as possible. She is going to try Aleve as needed for pain.

## 2013-10-23 NOTE — Telephone Encounter (Signed)
Faxed in tramadol Rx. Huntley DecSara has already talked w/pt, see other message 10/22/13.

## 2013-11-18 ENCOUNTER — Other Ambulatory Visit: Payer: Self-pay | Admitting: Family Medicine

## 2013-11-19 ENCOUNTER — Telehealth: Payer: Self-pay

## 2013-11-19 MED ORDER — TIZANIDINE HCL 2 MG PO CAPS: 2.0000 mg | ORAL_CAPSULE | Freq: Three times a day (TID) | ORAL | Status: DC | PRN

## 2013-11-19 NOTE — Telephone Encounter (Signed)
Call --- I sent in Tizanidine to pharmacy to try; it may be easier to swallow.

## 2013-11-19 NOTE — Telephone Encounter (Signed)
Pharm called to say that pt told them that the Robaxin is too big for her to swallow. Wants to know if we can try something else. Please advise. Thanks

## 2013-11-20 NOTE — Telephone Encounter (Signed)
LM for pt- advising new script sent to pharmacy.

## 2013-11-20 NOTE — Telephone Encounter (Signed)
Please call in refill of tramadol to pharmacy as approved.

## 2013-11-21 NOTE — Telephone Encounter (Signed)
Called in.

## 2013-12-17 ENCOUNTER — Other Ambulatory Visit: Payer: Self-pay | Admitting: Family Medicine

## 2013-12-18 ENCOUNTER — Other Ambulatory Visit: Payer: Self-pay | Admitting: Family Medicine

## 2014-01-01 ENCOUNTER — Telehealth: Payer: Self-pay | Admitting: Family Medicine

## 2014-01-01 NOTE — Telephone Encounter (Signed)
Left a message for patient to return call to let us know if she has received flu shot this season.

## 2014-02-04 ENCOUNTER — Telehealth: Payer: Self-pay | Admitting: *Deleted

## 2014-02-04 NOTE — Telephone Encounter (Signed)
Per patient she had the flu shot and Prevnar 13 at the Pharmacy in Oct. 2015.

## 2014-02-11 ENCOUNTER — Other Ambulatory Visit: Payer: Self-pay | Admitting: Family Medicine

## 2014-03-08 ENCOUNTER — Other Ambulatory Visit: Payer: Self-pay | Admitting: Family Medicine

## 2014-06-27 ENCOUNTER — Encounter: Payer: Self-pay | Admitting: *Deleted

## 2014-07-11 ENCOUNTER — Other Ambulatory Visit: Payer: Self-pay | Admitting: Family Medicine

## 2014-07-13 NOTE — Telephone Encounter (Signed)
FINAL NOTICE. NEEDS OV FOR FURTHER REFILLS.

## 2014-07-13 NOTE — Telephone Encounter (Signed)
Pt has appointment on 08/28/2014. Can we refill until then?

## 2014-08-07 ENCOUNTER — Other Ambulatory Visit: Payer: Self-pay | Admitting: Physician Assistant

## 2014-08-27 ENCOUNTER — Other Ambulatory Visit: Payer: Self-pay | Admitting: Family Medicine

## 2014-08-28 ENCOUNTER — Encounter: Payer: Self-pay | Admitting: Family Medicine

## 2014-08-28 ENCOUNTER — Ambulatory Visit (INDEPENDENT_AMBULATORY_CARE_PROVIDER_SITE_OTHER): Payer: BC Managed Care – PPO | Admitting: Family Medicine

## 2014-08-28 VITALS — BP 123/75 | HR 90 | Temp 98.9°F | Resp 16 | Ht 64.0 in | Wt 124.0 lb

## 2014-08-28 DIAGNOSIS — F411 Generalized anxiety disorder: Secondary | ICD-10-CM | POA: Diagnosis not present

## 2014-08-28 DIAGNOSIS — M5431 Sciatica, right side: Secondary | ICD-10-CM

## 2014-08-28 DIAGNOSIS — B001 Herpesviral vesicular dermatitis: Secondary | ICD-10-CM | POA: Diagnosis not present

## 2014-08-28 MED ORDER — VALACYCLOVIR HCL 1 G PO TABS: 2000.0000 mg | ORAL_TABLET | Freq: Two times a day (BID) | ORAL | Status: DC

## 2014-08-28 MED ORDER — TIZANIDINE HCL 2 MG PO TABS: 2.0000 mg | ORAL_TABLET | Freq: Three times a day (TID) | ORAL | Status: DC | PRN

## 2014-08-28 MED ORDER — PAROXETINE HCL 40 MG PO TABS: 60.0000 mg | ORAL_TABLET | Freq: Every day | ORAL | Status: DC

## 2014-08-28 NOTE — Progress Notes (Signed)
Subjective:    Patient ID: Holly Farmer, female    DOB: [birth year], 69 y.o.   MRN: [id number]  08/28/2014  Anxiety and fever blisters   HPI This 69 y.o. female presents for one year follow-up:  1. Anxiety: Patient reports good compliance with medication, good tolerance to medication, and good symptom control.  Has 52 month old granddaughter.  Loves grandchild.  Returned from [place].  Husband drives pt crazy.  Husband has gout.  Grumpy bear ever.  Worries about husband dropping dead on pt; husband is not healthy.  Worried that will find husband dead.  Sleeping well.  No insomnia.  Has big golden retriever who sleeps with pt.  Running two business.  Husband will return to school soon; in charge of creative writing at [employer].  2.  HSV blister: chronic issue; new onset.  Husband is hyeractive.    3.  R sciatica:  Referred to ortho after last visit; +muscle spasm intermittent; muscle relaxer works really well.  No n/t/w in leg; no saddle paresthesias.  Pain free currently; requesting refill of medication.  4. Health maintenance: pt refuses mammogram, colonoscopy, health maintenance measures at this time.  Review of Systems  Constitutional: Negative for fever, chills, diaphoresis and fatigue.  Eyes: Negative for visual disturbance.  Respiratory: Negative for cough and shortness of breath.   Cardiovascular: Negative for chest pain, palpitations and leg swelling.  Gastrointestinal: Negative for nausea, vomiting, abdominal pain, diarrhea and constipation.  Endocrine: Negative for cold intolerance, heat intolerance, polydipsia, polyphagia and polyuria.  Musculoskeletal: Positive for myalgias and back pain. Negative for joint swelling, arthralgias, gait problem, neck pain and neck stiffness.  Neurological: Negative for dizziness, tremors, seizures, syncope, facial asymmetry, speech difficulty, weakness, light-headedness, numbness and headaches.  Psychiatric/Behavioral: Negative for  suicidal ideas, sleep disturbance, self-injury and dysphoric mood. The patient is nervous/anxious.     Past Medical History  Diagnosis Date  . Anxiety    Past Surgical History  Procedure Laterality Date  . Appendectomy     No Known Allergies Social History   Social History  . Marital Status: Unknown    Spouse Name: N/A  . Number of Children: 2  . Years of Education: N/A   Occupational History  . 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 Smoking status: Current Every Day Smoker  . Smokeless tobacco: Never Used  . Alcohol Use: 1.2 oz/week    2 Glasses of wine per week  . Drug Use: No  . Sexual Activity: Yes   Other Topics Concern  . Not on file   Social History Narrative   Marital status:  Marriedx 36 years. Happily married; no abuse.      Children: 2 children; no grandchildren.      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      Tobacco: daily smoker less than 1ppd.       Alcohol: socially.; once per month      Drugs: none      Exercise: no formal exercise.  Kayaking once per month; moves furniture a lot at work.            Family History  Problem Relation Age of Onset  . Atrial fibrillation Mother   . Arthritis Mother         Objective:    BP 123/75 mmHg  Pulse 90  Temp(Src) 98.9 F (37.2 C) (Oral)  Resp 16  Ht  (1.626 m)  Wt 124 lb (56.246 kg)  BMI 21.27 kg/m2  SpO2 96% Physical Exam  Constitutional: She is oriented to person, place, and time. She appears well-developed and well-nourished. No distress.  Poorly groomed.  HENT:  Head: Normocephalic and atraumatic.  Right Ear: External ear normal.  Left Ear: External ear normal.  Nose: Nose normal.  Mouth/Throat: Oropharynx is clear and moist.  Eyes: Conjunctivae and EOM are normal. Pupils are equal, round, and reactive to light.  Neck: Normal range of motion. Neck supple. Carotid bruit is not present. No thyromegaly present.    Cardiovascular: Normal rate, regular rhythm, normal heart sounds and intact distal pulses.  Exam reveals no gallop and no friction rub.   No murmur heard. Pulmonary/Chest: Effort normal and breath sounds normal. She has no wheezes. She has no rales.  Abdominal: Soft. Bowel sounds are normal. She exhibits no distension and no mass. There is no tenderness. There is no rebound and no guarding.  Musculoskeletal:       Lumbar back: Normal. She exhibits normal range of motion, no tenderness, no bony tenderness and no pain.  Lumbar spine:  Non-tender midline; non-tender paraspinal regions B.  Straight leg raises negative B; toe and heel walking intact; marching intact; motor 5/5 BLE.  Full ROM lumbar spine without limitation.   Lymphadenopathy:    She has no cervical adenopathy.  Neurological: She is alert and oriented to person, place, and time. No cranial nerve deficit.  Skin: Skin is warm and dry. No rash noted. She is not diaphoretic. No erythema. No pallor.  Psychiatric: She has a normal mood and affect. Her behavior is normal.   No results found for this or any previous visit.     Assessment & Plan:   1. Generalized anxiety disorder   2. Herpes labialis   3. Sciatica, right     1. Generalized anxiety disorder: worsening due to family stressors; increase Paxil to  daily as needed. 2.  Herpes labialis: chronic with recent recurrence; rx for Valtrex provided and advised on PRN use. 3. R sciatica: much improved; refill of Zanaflex provided.   No orders of the defined types were placed in this encounter.    Meds ordered this encounter  Medications  . PARoxetine (PAXIL) 40 MG tablet    Sig: Take 1.5 tablets (60 mg total) by mouth daily. NO MORE REFILLS WITHOUT OFFICE VISIT - 2ND NOTICE    Dispense:  45 tablet    Refill:  11    CYCLE FILL MEDICATION. Authorization is required for next refill.  . valACYclovir (VALTREX) 1000 MG tablet    Sig: Take 2 tablets (2,000 mg total) by  mouth 2 (two) times daily. For one day only    Dispense:  20 tablet    Refill:  1  . tiZANidine (ZANAFLEX) 2 MG tablet    Sig: Take 1 tablet (2 mg total) by mouth every 8 (eight) hours as needed for muscle spasms.    Dispense:  60 tablet    Refill:  5    Return in about 6 months (around 02/28/2015) for recheck.     Cavan Bearden Paulita Fujita, M.D. [facility] [address] [place], [place]  [address] [phone] phone [phone] fax

## 2014-08-29 ENCOUNTER — Telehealth: Payer: Self-pay

## 2014-08-29 NOTE — Telephone Encounter (Signed)
Spoke with pt, everything is taken care of.

## 2014-08-29 NOTE — Telephone Encounter (Signed)
Pt has several questions about her PARoxetine (PAXIL) 40 MG tablet [[id number]] prescriptions. Her pharmacy is not going to fill this script, but it has 11 refills on it. She was seen here yesterday, but the script says she needs an office visit. Please advise at [phone]

## 2014-10-28 ENCOUNTER — Telehealth: Payer: Self-pay | Admitting: Family Medicine

## 2014-10-28 NOTE — Telephone Encounter (Signed)
Spoke with patient about mammogram and she refuses mammogram at this time.

## 2015-02-07 ENCOUNTER — Telehealth: Payer: Self-pay

## 2015-02-07 NOTE — Telephone Encounter (Signed)
Spoke with pt, her throat hurts and a runny nose. She has a lot to do her husband and wants to know if she can get Zpak. She is exhausted and she does not feel she has a fever. She feels she is coming down with something and wants to stop it so she can be there with her husband. SHe wants Dr. Katrinka BlazingSmith to review. Please advise.

## 2015-02-07 NOTE — Telephone Encounter (Signed)
HUSBAND IN ICU FOR 8 DAYS, SHE IS SICK AND CAN NOT LEAVE   REQUESTING Z-PACK   [facility] [address]   [phone]

## 2015-02-09 MED ORDER — AMOXICILLIN-POT CLAVULANATE 875-125 MG PO TABS: 1.0000 | ORAL_TABLET | Freq: Two times a day (BID) | ORAL | Status: DC

## 2015-02-09 MED ORDER — BENZONATATE 100 MG PO CAPS: 100.0000 mg | ORAL_CAPSULE | Freq: Three times a day (TID) | ORAL | Status: DC | PRN

## 2015-02-09 MED ORDER — DM-GUAIFENESIN ER 30-600 MG PO TB12: 1.0000 | ORAL_TABLET | Freq: Two times a day (BID) | ORAL | Status: DC

## 2015-02-09 NOTE — Telephone Encounter (Signed)
Call --- I sent in the following:  1.  Augmentin 875mg  one tablet twice daily.  2. Mucinex DM twice daily for cough, congestion.  3. Tessalon perles as needed for cough (if she has one).

## 2015-02-10 NOTE — Telephone Encounter (Signed)
Pt advised.

## 2015-03-08 ENCOUNTER — Other Ambulatory Visit: Payer: Self-pay | Admitting: Family Medicine

## 2015-09-15 ENCOUNTER — Other Ambulatory Visit: Payer: Self-pay | Admitting: *Deleted

## 2015-09-15 ENCOUNTER — Other Ambulatory Visit: Payer: Self-pay | Admitting: Family Medicine

## 2015-09-15 MED ORDER — PAROXETINE HCL 40 MG PO TABS: ORAL_TABLET | ORAL | 0 refills | Status: DC

## 2015-10-15 ENCOUNTER — Other Ambulatory Visit: Payer: Self-pay | Admitting: Family Medicine

## 2016-02-06 IMAGING — CR DG LUMBAR SPINE COMPLETE 4+V
5 series · 5 of 5 positions shown · non-contrast
Comparison: None.

CLINICAL DATA: Low back pain for 3 months.

EXAM:
LUMBAR SPINE - COMPLETE 4+ VIEW

[left lateral]
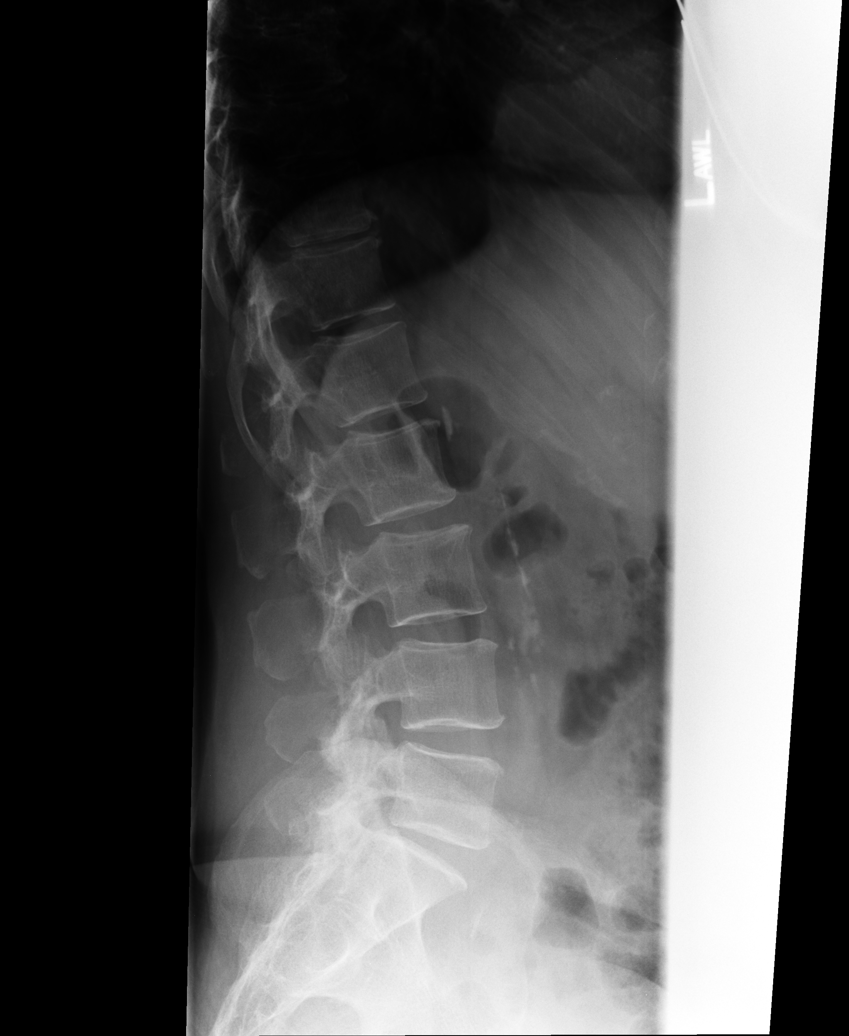

[AP]
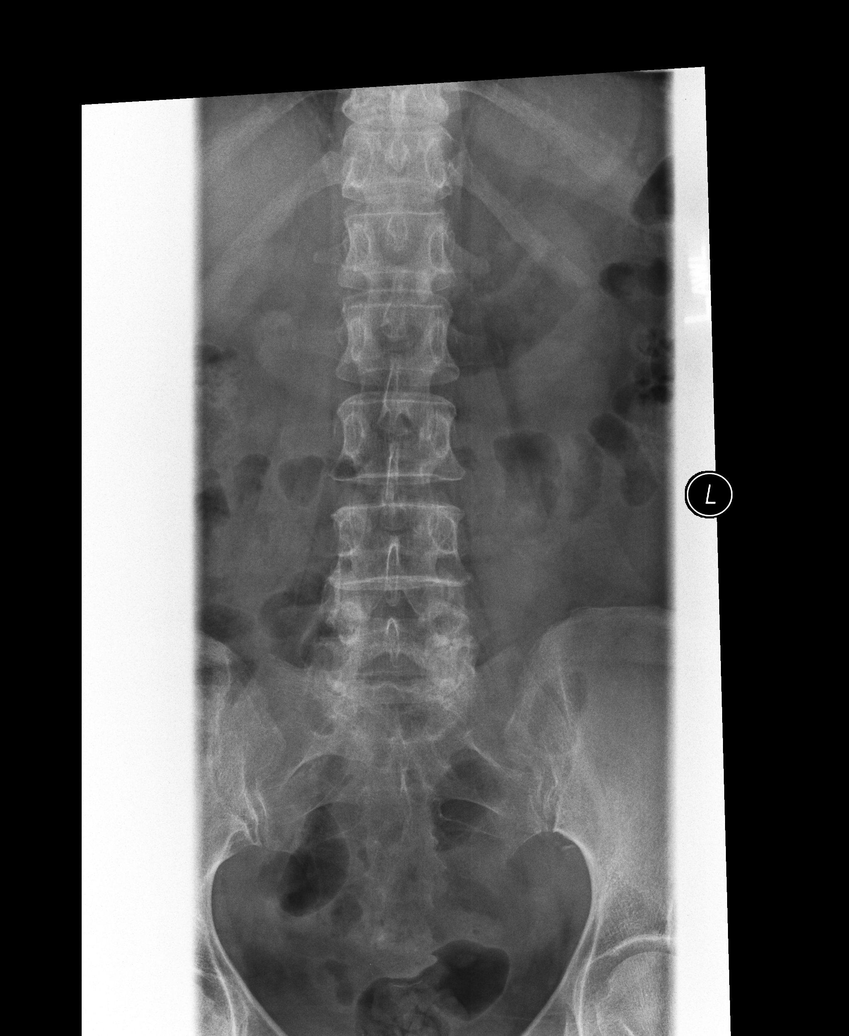

[other]
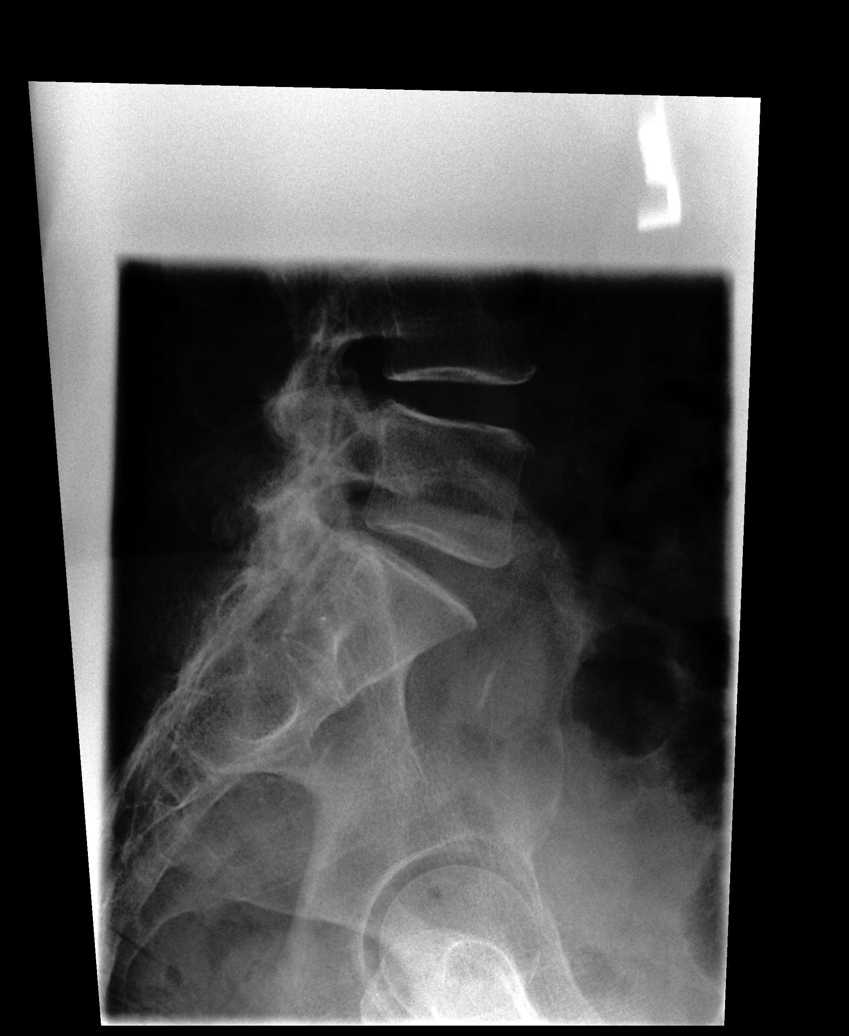

[oblique (1 of 2)]
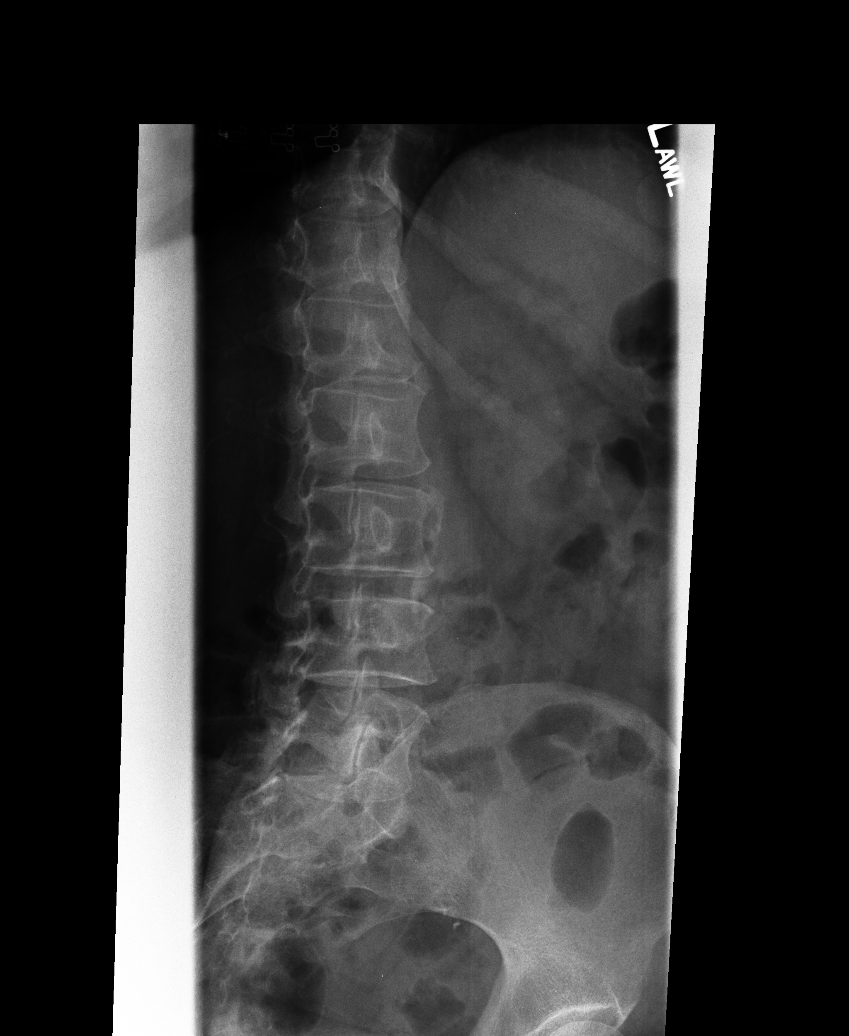

[oblique (2 of 2)]
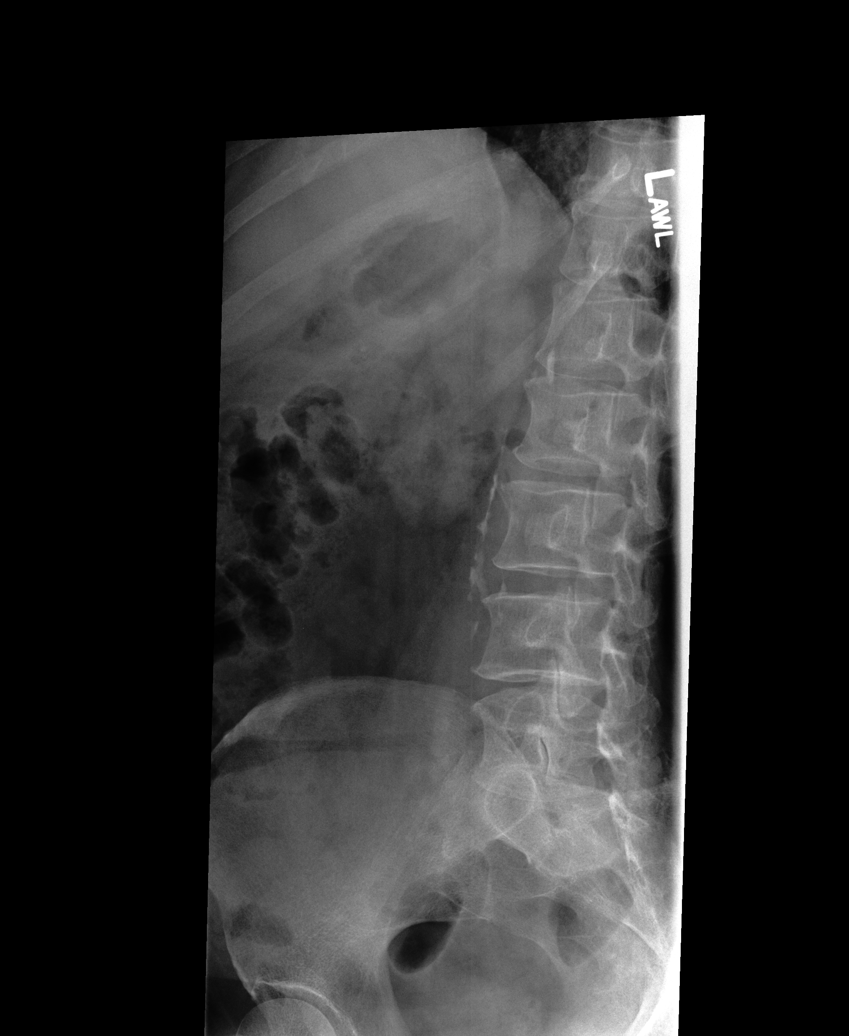

[5 of 5 positions shown; findings below may reference images not displayed]

FINDINGS: There is no evidence of lumbar spine fracture. Alignment is normal.
Intervertebral disc spaces are maintained.
IMPRESSION: Negative.

## 2016-07-01 ENCOUNTER — Ambulatory Visit (INDEPENDENT_AMBULATORY_CARE_PROVIDER_SITE_OTHER): Payer: Medicare Other | Admitting: Physician Assistant

## 2016-07-01 ENCOUNTER — Encounter: Payer: Self-pay | Admitting: Physician Assistant

## 2016-07-01 VITALS — BP 130/70 | HR 86 | Temp 98.1°F | Resp 16 | Ht 63.0 in | Wt 125.4 lb

## 2016-07-01 DIAGNOSIS — W57XXXA Bitten or stung by nonvenomous insect and other nonvenomous arthropods, initial encounter: Secondary | ICD-10-CM

## 2016-07-01 DIAGNOSIS — L298 Other pruritus: Secondary | ICD-10-CM | POA: Diagnosis not present

## 2016-07-01 DIAGNOSIS — S30861A Insect bite (nonvenomous) of abdominal wall, initial encounter: Secondary | ICD-10-CM | POA: Diagnosis not present

## 2016-07-01 MED ORDER — CLOBETASOL PROPIONATE 0.05 % EX CREA: 1.0000 "application " | TOPICAL_CREAM | Freq: Two times a day (BID) | CUTANEOUS | 6 refills | Status: DC

## 2016-07-01 NOTE — Progress Notes (Signed)
    07/03/2016 10:58 AM   DOB: September 11, 1945 / MRN: [id number]  SUBJECTIVE:  Holly Farmer is a 71 y.o. female presenting for a tic bite that started 7 days ago.  This was on the left lower abdomen. Says this fell off of her dog and then bit her.   Her husband died in October of 2017 and she has been getting a rash on the palms of her hands ever since.  The rash is itchy.  She denies a history of asthma or ASA.  No food allergies.  No history of eczema.  She was married to her husband of 45 years and they were monogamous.  No new partners since his death. She is a smoker.   She has No Known Allergies.   She  has a past medical history of Anxiety.    She  reports that she has been smoking.  She has never used smokeless tobacco. She reports that she drinks about 1.2 oz of alcohol per week . She reports that she does not use drugs. She  reports that she currently engages in sexual activity. The patient  has a past surgical history that includes Appendectomy.  Her family history includes Arthritis in her mother; Atrial fibrillation in her mother.  Review of Systems  Respiratory: Negative.   Cardiovascular: Negative.   Gastrointestinal: Negative.   Genitourinary: Negative.   Skin: Positive for itching and rash.    The problem list and medications were reviewed and updated by myself where necessary and exist elsewhere in the encounter.   OBJECTIVE:  BP 130/70   Pulse 86   Temp 98.1 F (36.7 C) (Oral)   Resp 16   Ht 5\' 3"  (1.6 m)   Wt 125 lb 6.4 oz (56.9 kg)   SpO2 96%   BMI 22.21 kg/m   Physical Exam  Constitutional: She is active.  Non-toxic appearance.  Cardiovascular: Normal rate.   Pulmonary/Chest: Effort normal. No tachypnea.  Neurological: She is alert.  Skin: Skin is warm and dry. Rash (see photo, negative for tenderness) noted. She is not diaphoretic. No pallor.          No results found for this or any previous visit (from the past 72 hour(s)).  No results  found.  ASSESSMENT AND PLAN:  [name] was seen today for tick bite and hands.  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  Labs offered and she wants to try topical steroid today.  Orders: -     clobetasol cream (TEMOVATE) 0.05 %; Apply 1 application topically 2 (two) times daily.  Tick bite of abdomen, initial encounter -     clobetasol cream (TEMOVATE) 0.05 %; Apply 1 application topically 2 (two) times daily.    The patient is advised to call or return to clinic if she does not see an improvement in symptoms, or to seek the care of the closest emergency department if she worsens with the above plan.   Deliah BostonMichael Gonser, MHS, PA-C [facility] 07/03/2016 10:58 AM

## 2016-07-01 NOTE — Patient Instructions (Addendum)
  Apply the cream to any itchy spots as instructed.  If the rash on your hands does not improve then please come back for lab work.    IF you received an x-ray today, you will receive an invoice from [facility]. Please contact [facility] at [phone] with questions or concerns regarding your invoice.   IF you received labwork today, you will receive an invoice from [facility]. Please contact [facility] at [phone] with questions or concerns regarding your invoice.   Our billing staff will not be able to assist you with questions regarding bills from these companies.  You will be contacted with the lab results as soon as they are available. The fastest way to get your results is to activate your My Chart account. Instructions are located on the last page of this paperwork. If you have not heard from us regarding the results in 2 weeks, please contact this office.

## 2016-11-05 ENCOUNTER — Telehealth: Payer: Self-pay | Admitting: Physician Assistant

## 2016-11-05 ENCOUNTER — Ambulatory Visit: Payer: Medicare Other | Admitting: Physician Assistant

## 2016-11-05 NOTE — Telephone Encounter (Signed)
Patient and her daughter came in at 5:36pm to the [address] for her apt that was scheduled at 5:20pm with Chelle at [address]. Malachi Bonds called down to me at [address] and asked me if Chelle could still see her I told her no because policy is 10 minutes and she was late she would have to reschedule. Patients daughter starting saying she was not leaving until she saw someone. Malachi Bonds asked me to come to 102 and talk to patient. I went up and as soon as I walked up to patients daughter she stated she did not want to talk to me that she wanted to talk to someone over me. I explained to her that I was the only one here that she could speak to as everyone else had left or was off today. She told me that she had trouble getting here for the appointment because the street lights were out and that we needed to make an expectation for her mother.   I explained that I could not make an expectation for her mother because then I would have to do it for every other patient. And then she demanded to talk to Chelle I explained to her that Chelle was seeing patients and that I could not pull her away from caring from them. She then told me that Chelle is her neighbor and she would just go over and talk to her later. She asked for a business card with the number and also for me to write my name on it. I provided the patient with this they then made an appointment with Dr Katrinka Blazing on 11/06/16 with Malachi Bonds. Then Pietro Cassis looked at patients "bug bite" on her forehead and let the patients daughter know that it was not serious enough to go to the ER for. They left and as the patient's daughter was walking out of the door she stated to her mother "that girl is such a  b (actually stating the word)".   I came down to 104 and explained what happened to [facility], she asked me to document it all in her chart and that the patient and her daughter can talk with Dr Katrinka Blazing tomorrow at her appt.

## 2016-11-06 ENCOUNTER — Ambulatory Visit (INDEPENDENT_AMBULATORY_CARE_PROVIDER_SITE_OTHER): Payer: Medicare Other | Admitting: Family Medicine

## 2016-11-06 ENCOUNTER — Encounter: Payer: Self-pay | Admitting: Family Medicine

## 2016-11-06 VITALS — BP 120/70 | HR 86 | Temp 97.7°F | Resp 16 | Ht 63.0 in | Wt 122.0 lb

## 2016-11-06 DIAGNOSIS — F329 Major depressive disorder, single episode, unspecified: Secondary | ICD-10-CM

## 2016-11-06 DIAGNOSIS — R5383 Other fatigue: Secondary | ICD-10-CM

## 2016-11-06 DIAGNOSIS — W57XXXA Bitten or stung by nonvenomous insect and other nonvenomous arthropods, initial encounter: Secondary | ICD-10-CM | POA: Diagnosis not present

## 2016-11-06 DIAGNOSIS — F411 Generalized anxiety disorder: Secondary | ICD-10-CM

## 2016-11-06 DIAGNOSIS — Z1159 Encounter for screening for other viral diseases: Secondary | ICD-10-CM | POA: Diagnosis not present

## 2016-11-06 DIAGNOSIS — J0100 Acute maxillary sinusitis, unspecified: Secondary | ICD-10-CM | POA: Diagnosis not present

## 2016-11-06 DIAGNOSIS — Z13220 Encounter for screening for lipoid disorders: Secondary | ICD-10-CM | POA: Diagnosis not present

## 2016-11-06 DIAGNOSIS — F32A Depression, unspecified: Secondary | ICD-10-CM

## 2016-11-06 MED ORDER — CYANOCOBALAMIN 1000 MCG/ML IJ SOLN
1000.0000 ug | INTRAMUSCULAR | Status: AC
  Administered 2016-11-06: 1000 ug via INTRAMUSCULAR

## 2016-11-06 MED ORDER — DOXYCYCLINE HYCLATE 100 MG PO CAPS: 100.0000 mg | ORAL_CAPSULE | Freq: Two times a day (BID) | ORAL | 0 refills | Status: DC

## 2016-11-06 MED ORDER — FLUTICASONE PROPIONATE 50 MCG/ACT NA SUSP: 2.0000 | Freq: Every day | NASAL | 6 refills | Status: DC

## 2016-11-06 MED ORDER — PAROXETINE HCL 20 MG PO TABS: ORAL_TABLET | ORAL | 1 refills | Status: DC

## 2016-11-06 NOTE — Patient Instructions (Signed)
     IF you received an x-ray today, you will receive an invoice from [facility]. Please contact  Radiology at [phone] with questions or concerns regarding your invoice.   IF you received labwork today, you will receive an invoice from [facility]. Please contact [facility] at [phone] with questions or concerns regarding your invoice.   Our billing staff will not be able to assist you with questions regarding bills from these companies.  You will be contacted with the lab results as soon as they are available. The fastest way to get your results is to activate your My Chart account. Instructions are located on the last page of this paperwork. If you have not heard from us regarding the results in 2 weeks, please contact this office.     

## 2016-11-06 NOTE — Progress Notes (Signed)
Subjective:    Patient ID: Holly Farmer, female    DOB: Dec 24, 1945, 71 y.o.   MRN: [id number]  11/06/2016  Insect Bite (bittin by a misquito a week ago and bit is swelling ) and Facial Pain (sinus pressure/ pain, sneezing, cough, watery x 1 week)    HPI This 71 y.o. female presents with daughter/Holly Farmer for evaluation of insect bite forehead, sinus congestion, fatigue, word finding difficulty.  Chronically ADD.  Daughter/Holly Farmer with patient today.  Rash on forehead: insect bite to forehead one week ago. Then in the past few days, redness increased and became painful in addition to itchy.  No fever/chills/sweats.  No drainage.    Sinus congestion: onset two weeks ago. No fever/chills/sweats.  No ear pain; no sore throat.  +rhinorrhea; +nasal congestion; +sinus pain/pressure.  Intermittent cough. Continues to smoke.  Has allergic rhinitis seasonally.  Anxiety and depression: non-compliant with follow-up with PCP; has not been taking Paxil because rx ran out.  Husband died in the past year.  Extreme stress.  Also has a specialty needs granddaughter and helps daughter a lot with grandchildren.  Continues to work and stay very active.   Requesting B12 injection because so tired all the time.     BP Readings from Last 3 Encounters:  11/06/16 120/70  07/01/16 130/70  08/28/14 123/75   Wt Readings from Last 3 Encounters:  11/06/16 122 lb (55.3 kg)  07/01/16 125 lb 6.4 oz (56.9 kg)  08/28/14 124 lb (56.2 kg)   Immunization History  Administered Date(s) Administered  . Influenza Split 11/21/2012  . Influenza-Unspecified 10/25/2013, 11/11/2015  . Pneumococcal Conjugate-13 10/25/2013  . Pneumococcal Polysaccharide-23 11/21/2012  . Tdap 10/03/2013    Review of Systems  Constitutional: Positive for fatigue. Negative for chills, diaphoresis and fever.  HENT: Positive for congestion, postnasal drip, rhinorrhea, sinus pain, sinus pressure and voice change. Negative for ear pain, sore throat  and trouble swallowing.   Eyes: Negative for visual disturbance.  Respiratory: Positive for cough. Negative for shortness of breath and wheezing.   Cardiovascular: Negative for chest pain, palpitations and leg swelling.  Gastrointestinal: Negative for abdominal pain, constipation, diarrhea, nausea and vomiting.  Endocrine: Negative for cold intolerance, heat intolerance, polydipsia, polyphagia and polyuria.  Skin: Positive for color change, rash and wound.  Neurological: Negative for dizziness, tremors, seizures, syncope, facial asymmetry, speech difficulty, weakness, light-headedness, numbness and headaches.  Psychiatric/Behavioral: Positive for dysphoric mood. The patient is nervous/anxious.     Past Medical History:  Diagnosis Date  . Anxiety    Past Surgical History:  Procedure Laterality Date  . APPENDECTOMY     No Known Allergies Current Outpatient Prescriptions on File Prior to Visit  Medication Sig Dispense Refill  . clobetasol cream (TEMOVATE) 0.05 % Apply 1 application topically 2 (two) times daily. 30 g 6  . tiZANidine (ZANAFLEX) 2 MG tablet Take 1 tablet (2 mg total) by mouth every 8 (eight) hours as needed for muscle spasms. 60 tablet 5  . valACYclovir (VALTREX) 1000 MG tablet TAKE 2 TABLETS (2,000 MG TOTAL) BY MOUTH 2 (TWO) TIMES DAILY. FOR ONE DAY ONLY 20 tablet 0   No current facility-administered medications on file prior to visit.    Social History   Social History  . Marital status: Unknown    Spouse name: N/A  . Number of children: 2  . Years of education: N/A   Occupational History  . 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 Smoking status: Current Every Day Smoker  . Smokeless tobacco: Never Used  . Alcohol use 1.2 oz/week    2 Glasses of wine per week  . Drug use: No  . Sexual activity: Yes   Other Topics Concern  . Not on file   Social History Narrative   Marital status:  Marriedx 36 years. Happily  married; no abuse.      Children: 2 children; no grandchildren.      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      Tobacco: daily smoker less than 1ppd.       Alcohol: socially.; once per month      Drugs: none      Exercise: no formal exercise.  Kayaking once per month; moves furniture a lot at work.            Family History  Problem Relation Age of Onset  . Atrial fibrillation Mother   . Arthritis Mother        Objective:    BP 120/70   Pulse 86   Temp 97.7 F (36.5 C)   Resp 16   Ht  (1.6 m)   Wt 122 lb (55.3 kg)   SpO2 97%   BMI 21.61 kg/m  Physical Exam  Constitutional: She is oriented to person, place, and time. She appears well-developed and well-nourished. No distress.  HENT:  Head: Normocephalic and atraumatic.    Right Ear: Tympanic membrane, external ear and ear canal normal.  Left Ear: Tympanic membrane, external ear and ear canal normal.  Nose: Right sinus exhibits maxillary sinus tenderness. Right sinus exhibits no frontal sinus tenderness. Left sinus exhibits maxillary sinus tenderness. Left sinus exhibits no frontal sinus tenderness.  Mouth/Throat: Oropharynx is clear and moist.  3 maculopapular lesions; no pustules or vesicles.  Diffuse erythema; no drainage; no fluctuance.  Eyes: Pupils are equal, round, and reactive to light. Conjunctivae and EOM are normal.  Neck: Normal range of motion. Neck supple. Carotid bruit is not present. No thyromegaly present.  Cardiovascular: Normal rate, regular rhythm, normal heart sounds and intact distal pulses.  Exam reveals no gallop and no friction rub.   No murmur heard. Pulmonary/Chest: Effort normal and breath sounds normal. She has no wheezes. She has no rales.  Abdominal: Soft. Bowel sounds are normal. She exhibits no distension and no mass. There is no tenderness. There is no rebound and no guarding.  Lymphadenopathy:    She has no cervical adenopathy.  Neurological: She is alert  and oriented to person, place, and time. No cranial nerve deficit. She exhibits normal muscle tone. Coordination normal.  Skin: Skin is warm and dry. No rash noted. She is not diaphoretic. No erythema. No pallor.  Psychiatric: She has a normal mood and affect. Her behavior is normal. Judgment and thought content normal.   No results found. Depression screen [facility] 2/9 11/06/2016 07/01/2016 08/28/2014 10/03/2013 08/22/2013  Decreased Interest 0 0 0 0 0  Down, Depressed, Hopeless 0 0 0 0 0  PHQ - 2 Score 0 0 0 0 0   Fall Risk  11/06/2016 07/01/2016 10/03/2013 08/22/2013  Falls in the past year? No No No No        Assessment & Plan:   1. Acute non-recurrent maxillary sinusitis   2. Insect bite, initial encounter   3. Fatigue due to depression   4. Generalized anxiety disorder   5. Need for hepatitis C screening test  6. Screening, lipid    -New onset acute maxillary sinusitis in a smoker; treat with Doxycycline and Flonase. -new onset insect bite with concerns of early secondary infection; rx for Doxycycline provided. RTC for acute worsening. -uncontrolled anxiety due to non-compliance with medical follow-up; grieving the loss of husband; restart Paxil  daily.  Previously maintained on  of Paxil daily. -new onset fatigue due to grief reaction; obtain labs to rule out secondary causes; s/p Vitamin B12 injection in office.   Orders Placed This Encounter  Procedures  . CBC with Differential/Platelet  . Comprehensive metabolic panel    Order Specific Question:   Has the patient fasted?    Answer:   No  . TSH  . Lipid panel    Order Specific Question:   Has the patient fasted?    Answer:   No  . Hemoglobin A1c  . Hepatitis C antibody   Meds ordered this encounter  Medications  . PARoxetine (PAXIL) 20 MG tablet    Sig: Take one tablet daily    Dispense:  90 tablet    Refill:  1  . cyanocobalamin ((VITAMIN B-12)) injection 1,000 mcg  . doxycycline (VIBRAMYCIN) 100 MG capsule     Sig: Take 1 capsule (100 mg total) by mouth 2 (two) times daily.    Dispense:  20 capsule    Refill:  0  . fluticasone (FLONASE) 50 MCG/ACT nasal spray    Sig: Place 2 sprays into both nostrils daily.    Dispense:  16 g    Refill:  6    No Follow-up on file.   Holly Farmer, M.D. Primary Care at [facility] previously [facility] [address] [place], [place]  [address] [phone] phone [phone] fax

## 2016-11-08 ENCOUNTER — Telehealth: Payer: Self-pay

## 2016-11-08 NOTE — Telephone Encounter (Signed)
Filled out request on cover my meds.  Key is [id number].  Should reach approval or denial within 72 business hours.

## 2016-11-09 ENCOUNTER — Encounter: Payer: Self-pay | Admitting: Family Medicine

## 2016-11-09 LAB — CBC WITH DIFFERENTIAL/PLATELET
Basophils Absolute: 0.1 10*3/uL (ref 0.0–0.2)
Basos: 1 %
EOS (ABSOLUTE): 0.4 10*3/uL (ref 0.0–0.4)
Eos: 4 %
HEMOGLOBIN: 14.2 g/dL (ref 11.1–15.9)
Hematocrit: 41.7 % (ref 34.0–46.6)
Immature Grans (Abs): 0 10*3/uL (ref 0.0–0.1)
Immature Granulocytes: 0 %
LYMPHS ABS: 3.6 10*3/uL — AB (ref 0.7–3.1)
LYMPHS: 37 %
MCH: 29.1 pg (ref 26.6–33.0)
MCHC: 34.1 g/dL (ref 31.5–35.7)
MCV: 86 fL (ref 79–97)
Monocytes Absolute: 0.6 10*3/uL (ref 0.1–0.9)
Monocytes: 6 %
Neutrophils Absolute: 5 10*3/uL (ref 1.4–7.0)
Neutrophils: 52 %
Platelets: 344 10*3/uL (ref 150–379)
RBC: 4.88 x10E6/uL (ref 3.77–5.28)
RDW: 14.5 % (ref 12.3–15.4)
WBC: 9.8 10*3/uL (ref 3.4–10.8)

## 2016-11-09 LAB — COMPREHENSIVE METABOLIC PANEL
A/G RATIO: 1.8 (ref 1.2–2.2)
ALT: 7 IU/L (ref 0–32)
AST: 18 IU/L (ref 0–40)
Albumin: 4.6 g/dL (ref 3.5–4.8)
Alkaline Phosphatase: 89 IU/L (ref 39–117)
BUN/Creatinine Ratio: 29 — ABNORMAL HIGH (ref 12–28)
BUN: 23 mg/dL (ref 8–27)
CHLORIDE: 104 mmol/L (ref 96–106)
CO2: 23 mmol/L (ref 20–29)
Calcium: 10 mg/dL (ref 8.7–10.3)
Creatinine, Ser: 0.79 mg/dL (ref 0.57–1.00)
GFR calc non Af Amer: 76 mL/min/{1.73_m2} (ref >59)
GFR, EST AFRICAN AMERICAN: 87 mL/min/{1.73_m2} (ref >59)
GLOBULIN, TOTAL: 2.5 g/dL (ref 1.5–4.5)
Glucose: 93 mg/dL (ref 65–99)
POTASSIUM: 4.7 mmol/L (ref 3.5–5.2)
SODIUM: 143 mmol/L (ref 134–144)
TOTAL PROTEIN: 7.1 g/dL (ref 6.0–8.5)

## 2016-11-09 LAB — HEPATITIS C ANTIBODY: Hep C Virus Ab: <0.1 s/co ratio (ref 0.0–0.9)

## 2016-11-09 LAB — LIPID PANEL
CHOL/HDL RATIO: 6.5 ratio — AB (ref 0.0–4.4)
CHOLESTEROL TOTAL: 247 mg/dL — AB (ref 100–199)
HDL: 38 mg/dL — AB (ref >39)
LDL CALC: 163 mg/dL — AB (ref 0–99)
TRIGLYCERIDES: 230 mg/dL — AB (ref 0–149)
VLDL CHOLESTEROL CAL: 46 mg/dL — AB (ref 5–40)

## 2016-11-09 LAB — TSH: TSH: 16.58 u[IU]/mL — ABNORMAL HIGH (ref 0.450–4.500)

## 2016-11-09 LAB — HEMOGLOBIN A1C
Est. average glucose Bld gHb Est-mCnc: 126 mg/dL
Hgb A1c MFr Bld: 6 % — ABNORMAL HIGH (ref 4.8–5.6)

## 2016-11-10 NOTE — Telephone Encounter (Signed)
PA for paroxetine was APPROVED until 01/24/2018.  Patient awarevia VM.

## 2016-11-16 ENCOUNTER — Encounter: Payer: Self-pay | Admitting: Family Medicine

## 2016-11-16 MED ORDER — ATORVASTATIN CALCIUM 10 MG PO TABS: 10.0000 mg | ORAL_TABLET | Freq: Every day | ORAL | 1 refills | Status: DC

## 2016-11-16 MED ORDER — LEVOTHYROXINE SODIUM 25 MCG PO TABS: 25.0000 ug | ORAL_TABLET | Freq: Every day | ORAL | 0 refills | Status: DC

## 2016-11-16 NOTE — Addendum Note (Signed)
Addended by: Ethelda ChickSMITH, Yuridia Couts M on: 11/16/2016 01:35 PM   Modules accepted: Orders

## 2016-11-17 ENCOUNTER — Telehealth: Payer: Self-pay | Admitting: Family Medicine

## 2016-11-17 NOTE — Telephone Encounter (Signed)
Pt is caling with her daughter to discuss the lab results further she states she got the results yesterday and is needing to talk with dr Katrinka Blazingsmith more  Best number [phone] or daughter [phone]

## 2016-11-17 NOTE — Progress Notes (Signed)
Letter sent.

## 2016-11-18 NOTE — Telephone Encounter (Signed)
Spoke with pt and her Daughter this Morning about labs and explained her results. Pt daughter verbalized understanding. I also advised pt to call back to schedule an appointment in 6 to 8 weeks for follow-up labs. She verbalized understanding.

## 2017-01-03 ENCOUNTER — Ambulatory Visit: Payer: Medicare Other | Admitting: Family Medicine

## 2017-01-26 ENCOUNTER — Ambulatory Visit: Payer: Medicare Other | Admitting: Family Medicine

## 2017-01-26 ENCOUNTER — Other Ambulatory Visit: Payer: Self-pay

## 2017-01-26 ENCOUNTER — Encounter: Payer: Self-pay | Admitting: Family Medicine

## 2017-01-26 VITALS — BP 142/82 | HR 78 | Temp 98.0°F | Resp 16 | Ht 63.78 in | Wt 119.0 lb

## 2017-01-26 DIAGNOSIS — E7439 Other disorders of intestinal carbohydrate absorption: Secondary | ICD-10-CM

## 2017-01-26 DIAGNOSIS — F329 Major depressive disorder, single episode, unspecified: Secondary | ICD-10-CM

## 2017-01-26 DIAGNOSIS — E034 Atrophy of thyroid (acquired): Secondary | ICD-10-CM

## 2017-01-26 DIAGNOSIS — Z720 Tobacco use: Secondary | ICD-10-CM | POA: Diagnosis not present

## 2017-01-26 DIAGNOSIS — E7800 Pure hypercholesterolemia, unspecified: Secondary | ICD-10-CM

## 2017-01-26 DIAGNOSIS — F419 Anxiety disorder, unspecified: Secondary | ICD-10-CM

## 2017-01-26 DIAGNOSIS — F32A Depression, unspecified: Secondary | ICD-10-CM

## 2017-01-26 MED ORDER — ROSUVASTATIN CALCIUM 10 MG PO TABS: 10.0000 mg | ORAL_TABLET | Freq: Every day | ORAL | 1 refills | Status: AC

## 2017-01-26 NOTE — Patient Instructions (Addendum)
     IF you received an x-ray today, you will receive an invoice from [facility]. Please contact [facility] at [phone] with questions or concerns regarding your invoice.   IF you received labwork today, you will receive an invoice from [facility]. Please contact [facility] at [phone] with questions or concerns regarding your invoice.   Our billing staff will not be able to assist you with questions regarding bills from these companies.  You will be contacted with the lab results as soon as they are available. The fastest way to get your results is to activate your My Chart account. Instructions are located on the last page of this paperwork. If you have not heard from us regarding the results in 2 weeks, please contact this office.      Hypothyroidism Hypothyroidism is a disorder of the thyroid. The thyroid is a large gland that is located in the lower front of the neck. The thyroid releases hormones that control how the body works. With hypothyroidism, the thyroid does not make enough of these hormones. What are the causes? Causes of hypothyroidism may include:  Viral infections.  Pregnancy.  Your own defense system (immune system) attacking your thyroid.  Certain medicines.  Birth defects.  Past radiation treatments to your head or neck.  Past treatment with radioactive iodine.  Past surgical removal of part or all of your thyroid.  Problems with the gland that is located in the center of your brain (pituitary).  What are the signs or symptoms? Signs and symptoms of hypothyroidism may include:  Feeling as though you have no energy (lethargy).  Inability to tolerate cold.  Weight gain that is not explained by a change in diet or exercise habits.  Dry skin.  Coarse hair.  Menstrual irregularity.  Slowing of thought processes.  Constipation.  Sadness or depression.  How is this diagnosed? Your health care provider may diagnose  hypothyroidism with blood tests and ultrasound tests. How is this treated? Hypothyroidism is treated with medicine that replaces the hormones that your body does not make. After you begin treatment, it may take several weeks for symptoms to go away. Follow these instructions at home:  Take medicines only as directed by your health care provider.  If you start taking any new medicines, tell your health care provider.  Keep all follow-up visits as directed by your health care provider. This is important. As your condition improves, your dosage needs may change. You will need to have blood tests regularly so that your health care provider can watch your condition. Contact a health care provider if:  Your symptoms do not get better with treatment.  You are taking thyroid replacement medicine and: ? You sweat excessively. ? You have tremors. ? You feel anxious. ? You lose weight rapidly. ? You cannot tolerate heat. ? You have emotional swings. ? You have diarrhea. ? You feel weak. Get help right away if:  You develop chest pain.  You develop an irregular heartbeat.  You develop a rapid heartbeat. This information is not intended to replace advice given to you by your health care provider. Make sure you discuss any questions you have with your health care provider. Document Released: 01/11/2005 Document Revised: 06/19/2015 Document Reviewed: 05/29/2013 Elsevier Interactive Patient Education  2018 Elsevier Inc.  

## 2017-01-26 NOTE — Progress Notes (Signed)
Subjective:    Patient ID: Holly Farmer, female    DOB: [birth year], 72 y.o.   MRN: [id number]  01/26/2017  Anxiety (follow-up )    HPI This 72 y.o. female presents for evaluation of anxiety, new onset hypothyroidism, new onset glucose intolerance, and hypercholesterolemia.    Having a hard time remembering medicine.  Drinkig thyroid medicine with egg nog every morning.   Atorvastatin is causing nausea. Thyroid medication is working fine.   Glucose intolerance: puts sugar in coffee; no longer eating cookies or cake for dinner.  Has changed diet has decreased sugar intake throughout the day.  Weight has decreased 3 pounds in the past 3 months.  Anxiety is stable and is tolerating Paxil therapy well without side effects.  Son has moved to the area and is actually living with patient currently to provide additional support to patient.   BP Readings from Last 3 Encounters:  01/26/17 (!) 142/82  11/06/16 120/70  07/01/16 130/70   Wt Readings from Last 3 Encounters:  01/26/17 119 lb (54 kg)  11/06/16 122 lb (55.3 kg)  07/01/16 125 lb 6.4 oz (56.9 kg)   Immunization History  Administered Date(s) Administered  . Influenza Split 11/21/2012  . Influenza-Unspecified 10/25/2013, 11/11/2015, 11/23/2016  . Pneumococcal Conjugate-13 10/25/2013  . Pneumococcal Polysaccharide-23 11/21/2012  . Pneumococcal-Unspecified 11/23/2016  . Tdap 10/03/2013    Review of Systems  Constitutional: Negative for chills, diaphoresis, fatigue and fever.  Eyes: Negative for visual disturbance.  Respiratory: Negative for cough and shortness of breath.   Cardiovascular: Negative for chest pain, palpitations and leg swelling.  Gastrointestinal: Negative for abdominal pain, constipation, diarrhea, nausea and vomiting.  Endocrine: Negative for cold intolerance, heat intolerance, polydipsia, polyphagia and polyuria.  Neurological: Negative for dizziness, tremors, seizures, syncope, facial asymmetry, speech  difficulty, weakness, light-headedness, numbness and headaches.  Psychiatric/Behavioral: Positive for dysphoric mood. Negative for hallucinations, self-injury, sleep disturbance and suicidal ideas. The patient is nervous/anxious.     Past Medical History:  Diagnosis Date  . Anxiety    Past Surgical History:  Procedure Laterality Date  . APPENDECTOMY     No Known Allergies Current Outpatient Medications on File Prior to Visit  Medication Sig Dispense Refill  . clobetasol cream (TEMOVATE) 0.05 % Apply 1 application topically 2 (two) times daily. 30 g 6  . fluticasone (FLONASE) 50 MCG/ACT nasal spray Place 2 sprays into both nostrils daily. 16 g 6  . levothyroxine (SYNTHROID, LEVOTHROID) 25 MCG tablet Take 1 tablet (25 mcg total) by mouth daily before breakfast. 90 tablet 0  . PARoxetine (PAXIL) 20 MG tablet Take one tablet daily 90 tablet 1  . tiZANidine (ZANAFLEX) 2 MG tablet Take 1 tablet (2 mg total) by mouth every 8 (eight) hours as needed for muscle spasms. 60 tablet 5  . valACYclovir (VALTREX) 1000 MG tablet TAKE 2 TABLETS (2,000 MG TOTAL) BY MOUTH 2 (TWO) TIMES DAILY. FOR ONE DAY ONLY 20 tablet 0   Current Facility-Administered Medications on File Prior to Visit  Medication Dose Route Frequency Provider Last Rate Last Dose  . cyanocobalamin ((VITAMIN B-12)) injection 1,000 mcg  1,000 mcg Intramuscular Q30 days [name], MD   1,000 mcg at 11/06/16 1258   Social History   Socioeconomic History  . Marital status: Widowed    Spouse name: Not on file  . Number of children: 2  . Years of education: Not on file  . Highest education level: Not on file  Social Needs  . Physicist, medical  strain: Not on file  . Food insecurity - worry: Not on file  . Food insecurity - inability: Not on file  . Transportation needs - medical: Not on file  . Transportation needs - non-medical: Not on file  Occupational History  . 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 Smoking status: Current Every Day Smoker  . Smokeless tobacco: Never Used  Substance and Sexual Activity  . Alcohol use: Yes    Alcohol/week: 1.2 oz    Types: 2 Glasses of wine per week  . Drug use: No  . Sexual activity: Not Currently    Birth control/protection: Post-menopausal  Other Topics Concern  . Not on file  Social History Narrative   Marital status:  Widowed in 2018 after married x 36 years.      Children: 2 children; 2 grandchildren.      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      Tobacco: daily smoker less than 1ppd.       Alcohol: socially.; once per month      Drugs: none      Exercise: no formal exercise.  Kayaking once per month; moves furniture a lot at work.         Family History  Problem Relation Age of Onset  . Atrial fibrillation Mother   . Arthritis Mother        Objective:    BP (!) 142/82   Pulse 78   Temp 98 F (36.7 C) (Oral)   Resp 16   Ht 5' 3.78" (1.62 m)   Wt 119 lb (54 kg)   SpO2 98%   BMI 20.57 kg/m  Physical Exam  Constitutional: She is oriented to person, place, and time. She appears well-developed and well-nourished. No distress.  HENT:  Head: Normocephalic and atraumatic.  Right Ear: External ear normal.  Left Ear: External ear normal.  Nose: Nose normal.  Mouth/Throat: Oropharynx is clear and moist.  Eyes: Conjunctivae and EOM are normal. Pupils are equal, round, and reactive to light.  Neck: Normal range of motion. Neck supple. Carotid bruit is not present. No thyromegaly present.  Cardiovascular: Normal rate, regular rhythm, normal heart sounds and intact distal pulses. Exam reveals no gallop and no friction rub.  No murmur heard. Pulmonary/Chest: Effort normal and breath sounds normal. She has no wheezes. She has no rales.  Abdominal: Soft. Bowel sounds are normal. She exhibits no distension and no mass. There is no tenderness. There is no rebound and no guarding.    Lymphadenopathy:    She has no cervical adenopathy.  Neurological: She is alert and oriented to person, place, and time. No cranial nerve deficit.  Skin: Skin is warm and dry. No rash noted. She is not diaphoretic. No erythema. No pallor.  Psychiatric: She has a normal mood and affect. Her behavior is normal.   No results found. Depression screen [facility] 2/9 01/26/2017 11/06/2016 07/01/2016 08/28/2014 10/03/2013  Decreased Interest 0 0 0 0 0  Down, Depressed, Hopeless 0 0 0 0 0  PHQ - 2 Score 0 0 0 0 0   Fall Risk  01/26/2017 11/06/2016 07/01/2016 10/03/2013 08/22/2013  Falls in the past year? No No No No No        Assessment & Plan:   1. Hypothyroidism due to acquired atrophy of thyroid   2. Pure hypercholesterolemia   3. Anxiety and depression   4. Glucose intolerance  Hypothyroidism new onset.  Patient tolerating levothyroxine 25 mcg daily.  Repeat labs today including antibody testing.  Likely will need to adjust levothyroxine dose.  Patient expressed understanding.  New onset hypercholesterolemia.  Patient intolerant to atorvastatin which is causing nausea.  We will switch to rosuvastatin 10 mg daily.  Uncontrolled Anxiety and depression ongoing yet tolerating Paxil therapy at this time at current dose.  No side effects to medication.  Son is recently moved in with patient.  Daughter also lives locally.  Excellent family support during this time of grief from loss of husband.  New onset glucose intolerance. I recommend weight loss, exercise, and low-carbohydrate low-sugar food choices. You should AVOID: regular sodas, sweetened tea, fruit juices.  You should LIMIT: breads, pastas, rice, potatoes, and desserts/sweets.  I would recommend limiting your total carbohydrate intake per meal to 45 grams; I would limit your total carbohydrate intake per snack to 30 grams.  I would also have a goal of 60 grams of protein intake per day; this would equal 10-15 grams of protein per meal and 5-10 grams of  protein per snack.  -prolonged face-to-face for 40 minutes with greater than 50% of time dedicated to counseling and coordination of care.  Orders Placed This Encounter  Procedures  . Comprehensive metabolic panel  . TSH  . T4, free  . Thyroid Peroxidase Antibody  . Thyroglobulin antibody   Meds ordered this encounter  Medications  . rosuvastatin (CRESTOR) 10 MG tablet    Sig: Take 1 tablet (10 mg total) by mouth daily.    Dispense:  90 tablet    Refill:  1    Return in about 3 months (around 04/26/2017) for follow-up chronic medical conditions.   Sybilla Malhotra Paulita FujitaMartin Richelle Glick, M.D. [facility]  [place] [facility] [address] [place], [place]  [id number] [phone] phone [phone] fax

## 2017-01-27 LAB — COMPREHENSIVE METABOLIC PANEL
A/G RATIO: 1.8 (ref 1.2–2.2)
ALBUMIN: 4.4 g/dL (ref 3.5–4.8)
ALK PHOS: 75 IU/L (ref 39–117)
ALT: 10 IU/L (ref 0–32)
AST: 17 IU/L (ref 0–40)
BILIRUBIN TOTAL: 0.2 mg/dL (ref 0.0–1.2)
BUN / CREAT RATIO: 22 (ref 12–28)
BUN: 20 mg/dL (ref 8–27)
CO2: 20 mmol/L (ref 20–29)
Calcium: 9.5 mg/dL (ref 8.7–10.3)
Chloride: 102 mmol/L (ref 96–106)
Creatinine, Ser: 0.89 mg/dL (ref 0.57–1.00)
GFR calc Af Amer: 75 mL/min/{1.73_m2} (ref >59)
GFR calc non Af Amer: 65 mL/min/{1.73_m2} (ref >59)
Globulin, Total: 2.5 g/dL (ref 1.5–4.5)
Glucose: 85 mg/dL (ref 65–99)
POTASSIUM: 4.3 mmol/L (ref 3.5–5.2)
SODIUM: 142 mmol/L (ref 134–144)
Total Protein: 6.9 g/dL (ref 6.0–8.5)

## 2017-01-27 LAB — THYROID PEROXIDASE ANTIBODY: Thyroperoxidase Ab SerPl-aCnc: 131 IU/mL — ABNORMAL HIGH (ref 0–34)

## 2017-01-27 LAB — THYROGLOBULIN ANTIBODY: Thyroglobulin Antibody: 23.8 IU/mL — ABNORMAL HIGH (ref 0.0–0.9)

## 2017-01-27 LAB — TSH: TSH: 10.72 u[IU]/mL — AB (ref 0.450–4.500)

## 2017-01-27 LAB — T4, FREE: FREE T4: 1 ng/dL (ref 0.82–1.77)

## 2017-02-07 ENCOUNTER — Encounter: Payer: Self-pay | Admitting: Family Medicine

## 2017-02-07 ENCOUNTER — Other Ambulatory Visit: Payer: Self-pay | Admitting: Family Medicine

## 2017-02-07 MED ORDER — LEVOTHYROXINE SODIUM 50 MCG PO TABS: 50.0000 ug | ORAL_TABLET | Freq: Every day | ORAL | 0 refills | Status: DC

## 2017-02-08 ENCOUNTER — Telehealth: Payer: Self-pay | Admitting: Family Medicine

## 2017-02-08 NOTE — Telephone Encounter (Signed)
Pt returned call and lab results given to patient as requested:  Sugars/glucose is normal at 85. Liver and kidney functions are normal. Thyroid level has improved that you need a higher dose of thyroid medication. I am going to send in levothyroxine 50 mcg daily to your pharmacy. Pt voiced understanding.

## 2017-02-09 DIAGNOSIS — Z720 Tobacco use: Secondary | ICD-10-CM | POA: Insufficient documentation

## 2017-02-12 ENCOUNTER — Other Ambulatory Visit: Payer: Self-pay | Admitting: Family Medicine

## 2017-02-14 NOTE — Telephone Encounter (Signed)
This is refill is for 25mcg but in the notes the dose was increased to 50mcg per by Dr. Katrinka BlazingSmith.

## 2017-03-02 ENCOUNTER — Telehealth: Payer: Self-pay | Admitting: Family Medicine

## 2017-03-02 NOTE — Telephone Encounter (Signed)
Copied from CRM [phone]. Topic: General - Other >> Mar 02, 2017 11:41 AM Cecelia ByarsGreen, Valjean Ruppel L, RMA wrote: Reason for CRM: patient is requesting a prescription be called in for doxycycline, please return pt call

## 2017-03-03 NOTE — Telephone Encounter (Signed)
Spoke with pt and she complains of runny nose, sore throat, coughing, brownish greenish phlegm x 2.5 weeks is why she is requesting and antibiotic.

## 2017-03-07 NOTE — Telephone Encounter (Signed)
Please call patient and advise that we do not call in antibiotics for a new infection; please offer office visit.

## 2017-03-08 NOTE — Telephone Encounter (Signed)
Pt notified and stated she can't come because of her babies

## 2017-04-25 ENCOUNTER — Ambulatory Visit: Payer: Medicare Other | Admitting: Family Medicine

## 2017-05-06 ENCOUNTER — Other Ambulatory Visit: Payer: Self-pay | Admitting: Family Medicine

## 2017-06-22 ENCOUNTER — Encounter: Payer: Self-pay | Admitting: Family Medicine

## 2017-07-01 ENCOUNTER — Other Ambulatory Visit: Payer: Self-pay | Admitting: Family Medicine

## 2017-07-01 ENCOUNTER — Other Ambulatory Visit: Payer: Self-pay | Admitting: Physician Assistant

## 2017-07-01 DIAGNOSIS — W57XXXA Bitten or stung by nonvenomous insect and other nonvenomous arthropods, initial encounter: Secondary | ICD-10-CM

## 2017-07-01 DIAGNOSIS — L298 Other pruritus: Secondary | ICD-10-CM

## 2017-07-01 DIAGNOSIS — S30861A Insect bite (nonvenomous) of abdominal wall, initial encounter: Secondary | ICD-10-CM

## 2017-07-01 NOTE — Telephone Encounter (Signed)
LOV  01/26/17 Holly Farmer Last refill 07/01/16  30g with 6 refills

## 2017-07-01 NOTE — Telephone Encounter (Signed)
levothyroxine refill Last Refill:05/06/17 # 90 No RF Last OV: 01/26/17 (did have appt 04/25/17 but was canceled by provider) PCP: Nilda SimmerKristi Smith MD [facility]

## 2017-07-27 ENCOUNTER — Other Ambulatory Visit: Payer: Self-pay | Admitting: Family Medicine

## 2017-07-27 NOTE — Telephone Encounter (Signed)
Synthroid 25 mcg refill request  LOV 01/26/17 with Dr. Katrinka BlazingSmith.   Had F/U appt 04/25/17 that was cancelled by provider  Last refill:  05/06/17   #90   0 refills  [facility] [address] [place], [place]

## 2017-09-25 ENCOUNTER — Other Ambulatory Visit: Payer: Self-pay | Admitting: Family Medicine

## 2017-12-23 ENCOUNTER — Other Ambulatory Visit: Payer: Self-pay | Admitting: Family Medicine

## 2017-12-23 NOTE — Telephone Encounter (Signed)
Pt called back and declined to schedule a follow up appt.

## 2017-12-23 NOTE — Telephone Encounter (Signed)
Left message for pt to return call to the office. Pt will need to set up f/u appt.

## 2017-12-23 NOTE — Telephone Encounter (Signed)
Requested medication (s) are due for refill today: yes  Requested medication (s) are on the active medication list: yes  Last refill:  09/28/17  Future visit scheduled: No  Notes to clinic:  Left message for pt to return call to the office to schedule appt.     Requested Prescriptions  Pending Prescriptions Disp Refills   PARoxetine (PAXIL) 20 MG tablet [Pharmacy Med Name: PARoxetine HCL 20 MG TABLET] 90 tablet 0    Sig: TAKE ONE TABLET BY MOUTH DAILY     Psychiatry:  Antidepressants - SSRI Failed - 12/23/2017 12:21 PM      Failed - Valid encounter within last 6 months    Recent Outpatient Visits          11 months ago Hypothyroidism due to acquired atrophy of thyroid   [facility] Myrle ShengKristi M, MD   1 year ago Acute non-recurrent maxillary sinusitis   [facility], Myrle ShengKristi M, MD   1 year ago Palmar pruritus   [facility], Marolyn HammockMichael L, PA-C   3 years ago Generalized anxiety disorder   [facility] My[facility]le Sheng[facility]rist[facility], MD   4 years ago Right sided sciatica   [facility] Myrle ShengKristi M, MD

## 2019-02-15 ENCOUNTER — Ambulatory Visit: Payer: Self-pay | Attending: Internal Medicine

## 2019-02-15 DIAGNOSIS — Z23 Encounter for immunization: Secondary | ICD-10-CM | POA: Insufficient documentation

## 2019-02-15 NOTE — Progress Notes (Signed)
   Covid-19 Vaccination Clinic  Name:  Holly Farmer    MRN: [id number] DOB: [birth year]  02/15/2019  Holly Farmer was observed post Covid-19 immunization for 15 minutes without incidence. She was provided with Vaccine Information Sheet and instruction to access the V-Safe system.   Holly Farmer was instructed to call 911 with any severe reactions post vaccine: Marland Kitchen Difficulty breathing  . Swelling of your face and throat  . A fast heartbeat  . A bad rash all over your body  . Dizziness and weakness    Immunizations Administered    Name Date Dose VIS Date Route   Pfizer COVID-19 Vaccine 02/15/2019  6:28 PM 0.3 mL 01/05/2019 Intramuscular   Manufacturer: [employer], [employer]   Lot: [id number]   NDC: [id number]

## 2019-03-08 ENCOUNTER — Ambulatory Visit: Payer: Self-pay | Attending: Internal Medicine

## 2019-03-08 ENCOUNTER — Other Ambulatory Visit: Payer: Self-pay

## 2019-03-08 DIAGNOSIS — Z23 Encounter for immunization: Secondary | ICD-10-CM | POA: Insufficient documentation

## 2019-03-08 NOTE — Progress Notes (Signed)
   Covid-19 Vaccination Clinic  Name:  VEE BAHE    MRN: [id number] DOB: 03/04/1945  03/08/2019  Ms. Gladu was observed post Covid-19 immunization for 15 minutes without incidence. She was provided with Vaccine Information Sheet and instruction to access the V-Safe system.   Ms. Zaragoza was instructed to call 911 with any severe reactions post vaccine: Marland Kitchen Difficulty breathing  . Swelling of your face and throat  . A fast heartbeat  . A bad rash all over your body  . Dizziness and weakness    Immunizations Administered    Name Date Dose VIS Date Route   Pfizer COVID-19 Vaccine 03/08/2019 11:56 AM 0.3 mL 01/05/2019 Intramuscular   Manufacturer: [employer], [employer]   Lot: [id number]   NDC: [address]-1

## 2019-06-08 ENCOUNTER — Ambulatory Visit: Payer: Self-pay | Admitting: Physician Assistant

## 2022-09-06 ENCOUNTER — Encounter (HOSPITAL_COMMUNITY): Payer: Self-pay | Admitting: Cardiology

## 2022-09-06 ENCOUNTER — Emergency Department (HOSPITAL_COMMUNITY): Payer: Medicare PPO

## 2022-09-06 ENCOUNTER — Other Ambulatory Visit: Payer: Self-pay

## 2022-09-06 ENCOUNTER — Inpatient Hospital Stay (HOSPITAL_COMMUNITY)
Admission: EM | Admit: 2022-09-06 | Discharge: 2022-09-09 | DRG: 243 | Disposition: A | Discharge status: Home / Self Care | Payer: Medicare PPO | Source: Ambulatory Visit | Attending: Internal Medicine | Admitting: Internal Medicine

## 2022-09-06 DIAGNOSIS — I441 Atrioventricular block, second degree: Secondary | ICD-10-CM

## 2022-09-06 DIAGNOSIS — M25472 Effusion, left ankle: Secondary | ICD-10-CM | POA: Diagnosis present

## 2022-09-06 DIAGNOSIS — I4430 Unspecified atrioventricular block: Secondary | ICD-10-CM | POA: Diagnosis present

## 2022-09-06 DIAGNOSIS — R0609 Other forms of dyspnea: Secondary | ICD-10-CM | POA: Diagnosis present

## 2022-09-06 DIAGNOSIS — Z79899 Other long term (current) drug therapy: Secondary | ICD-10-CM

## 2022-09-06 DIAGNOSIS — R609 Edema, unspecified: Secondary | ICD-10-CM | POA: Diagnosis not present

## 2022-09-06 DIAGNOSIS — Z8249 Family history of ischemic heart disease and other diseases of the circulatory system: Secondary | ICD-10-CM

## 2022-09-06 DIAGNOSIS — R600 Localized edema: Secondary | ICD-10-CM | POA: Diagnosis present

## 2022-09-06 DIAGNOSIS — Z8261 Family history of arthritis: Secondary | ICD-10-CM

## 2022-09-06 DIAGNOSIS — F17210 Nicotine dependence, cigarettes, uncomplicated: Secondary | ICD-10-CM | POA: Diagnosis present

## 2022-09-06 DIAGNOSIS — N179 Acute kidney failure, unspecified: Secondary | ICD-10-CM | POA: Diagnosis present

## 2022-09-06 DIAGNOSIS — F419 Anxiety disorder, unspecified: Secondary | ICD-10-CM | POA: Diagnosis present

## 2022-09-06 DIAGNOSIS — Z716 Tobacco abuse counseling: Secondary | ICD-10-CM

## 2022-09-06 DIAGNOSIS — Z79890 Hormone replacement therapy (postmenopausal): Secondary | ICD-10-CM

## 2022-09-06 DIAGNOSIS — I442 Atrioventricular block, complete: Principal | ICD-10-CM | POA: Diagnosis present

## 2022-09-06 DIAGNOSIS — I498 Other specified cardiac arrhythmias: Secondary | ICD-10-CM | POA: Diagnosis not present

## 2022-09-06 DIAGNOSIS — I10 Essential (primary) hypertension: Secondary | ICD-10-CM | POA: Diagnosis present

## 2022-09-06 DIAGNOSIS — I872 Venous insufficiency (chronic) (peripheral): Secondary | ICD-10-CM | POA: Diagnosis present

## 2022-09-06 DIAGNOSIS — E785 Hyperlipidemia, unspecified: Secondary | ICD-10-CM | POA: Diagnosis present

## 2022-09-06 DIAGNOSIS — R001 Bradycardia, unspecified: Secondary | ICD-10-CM | POA: Diagnosis present

## 2022-09-06 DIAGNOSIS — M7989 Other specified soft tissue disorders: Secondary | ICD-10-CM | POA: Diagnosis present

## 2022-09-06 DIAGNOSIS — E039 Hypothyroidism, unspecified: Secondary | ICD-10-CM | POA: Diagnosis present

## 2022-09-06 DIAGNOSIS — I452 Bifascicular block: Secondary | ICD-10-CM | POA: Diagnosis present

## 2022-09-06 LAB — CBC WITH DIFFERENTIAL/PLATELET
Abs Immature Granulocytes: 0.03 10*3/uL (ref 0.00–0.07)
Basophils Absolute: 0.1 10*3/uL (ref 0.0–0.1)
Basophils Relative: 1 %
Eosinophils Absolute: 0.3 10*3/uL (ref 0.0–0.5)
Eosinophils Relative: 3 %
HCT: 35 % — ABNORMAL LOW (ref 36.0–46.0)
Hemoglobin: 11.4 g/dL — ABNORMAL LOW (ref 12.0–15.0)
Immature Granulocytes: 0 %
Lymphocytes Relative: 33 %
Lymphs Abs: 3.3 10*3/uL (ref 0.7–4.0)
MCH: 29.2 pg (ref 26.0–34.0)
MCHC: 32.6 g/dL (ref 30.0–36.0)
MCV: 89.7 fL (ref 80.0–100.0)
Monocytes Absolute: 0.8 10*3/uL (ref 0.1–1.0)
Monocytes Relative: 8 %
Neutro Abs: 5.5 10*3/uL (ref 1.7–7.7)
Neutrophils Relative %: 55 %
Platelets: 237 10*3/uL (ref 150–400)
RBC: 3.9 MIL/uL (ref 3.87–5.11)
RDW: 13.7 % (ref 11.5–15.5)
WBC: 10.1 10*3/uL (ref 4.0–10.5)
nRBC: 0 % (ref 0.0–0.2)

## 2022-09-06 LAB — COMPREHENSIVE METABOLIC PANEL
ALT: 41 U/L (ref 0–44)
AST: 39 U/L (ref 15–41)
Albumin: 2.9 g/dL — ABNORMAL LOW (ref 3.5–5.0)
Alkaline Phosphatase: 80 U/L (ref 38–126)
Anion gap: 6 (ref 5–15)
BUN: 32 mg/dL — ABNORMAL HIGH (ref 8–23)
CO2: 21 mmol/L — ABNORMAL LOW (ref 22–32)
Calcium: 8.2 mg/dL — ABNORMAL LOW (ref 8.9–10.3)
Chloride: 111 mmol/L (ref 98–111)
Creatinine, Ser: 1.2 mg/dL — ABNORMAL HIGH (ref 0.44–1.00)
GFR, Estimated: 47 mL/min — ABNORMAL LOW (ref >60)
Glucose, Bld: 92 mg/dL (ref 70–99)
Potassium: 4 mmol/L (ref 3.5–5.1)
Sodium: 138 mmol/L (ref 135–145)
Total Bilirubin: 0.6 mg/dL (ref 0.3–1.2)
Total Protein: 5.7 g/dL — ABNORMAL LOW (ref 6.5–8.1)

## 2022-09-06 LAB — BRAIN NATRIURETIC PEPTIDE: B Natriuretic Peptide: 346.2 pg/mL — ABNORMAL HIGH (ref 0.0–100.0)

## 2022-09-06 LAB — TSH: TSH: 2.719 u[IU]/mL (ref 0.350–4.500)

## 2022-09-06 LAB — TROPONIN I (HIGH SENSITIVITY): Troponin I (High Sensitivity): 15 ng/L (ref <18)

## 2022-09-06 LAB — MAGNESIUM: Magnesium: 2.3 mg/dL (ref 1.7–2.4)

## 2022-09-06 MED ORDER — SPIRONOLACTONE 12.5 MG HALF TABLET
12.5000 mg | ORAL_TABLET | Freq: Every day | ORAL | Status: DC
  Administered 2022-09-06: 12.5 mg via ORAL
  Filled 2022-09-06 (×2): qty 1

## 2022-09-06 MED ORDER — HYDRALAZINE HCL 20 MG/ML IJ SOLN
10.0000 mg | Freq: Four times a day (QID) | INTRAMUSCULAR | Status: DC | PRN
  Administered 2022-09-07 – 2022-09-08 (×2): 10 mg via INTRAVENOUS
  Filled 2022-09-06 (×4): qty 1

## 2022-09-06 MED ORDER — SODIUM CHLORIDE 0.9 % IV BOLUS
250.0000 mL | Freq: Once | INTRAVENOUS | Status: AC
  Administered 2022-09-06: 250 mL via INTRAVENOUS

## 2022-09-06 NOTE — ED Triage Notes (Signed)
Per EMS pt was at the doctors office for left ankle swelling, her primary was concerned of her bradycardia and sent her to be seen at ED.

## 2022-09-06 NOTE — ED Provider Notes (Signed)
[place] EMERGENCY DEPARTMENT AT [facility] Provider Note  MDM   HPI/ROS:  Holly Farmer is a 77 y.o. female with medical history of hypothyroidism, no cardiac history presenting from PCPs office out of concern for bradycardia.  Patient was at a PCP appointment for lower extremity swelling, where EKG was performed and notable for bradycardia with an EKG suggestive of Mobitz 2 with 2-1 AV block.  After discovering this, patient was sent to the emergency department for further evaluation.  She denies any lightheadedness, syncope/presyncope, dizziness, chest pain, shortness of breath.  Patient has had bilateral lower extremity swelling that has been present since around 2:00 yesterday afternoon.  Physical exam is notable for: - Overall well-appearing, no acute distress - Cardiopulmonary exam within normal limits - Very mild bilateral lower extremity pitting edema  On my initial evaluation, patient is:  -Vital signs notable for heart rate between upper 20s and low 40s.  Hemodynamically stable otherwise with systolic blood pressures in the 160s to 180s. -Additional history obtained from daughter at bedside  Given the patient's history and physical exam, differential diagnosis includes but is not limited to AV block, sick sinus syndrome, tachybradycardia syndrome, infectious causes, etc.  Interpretations, interventions, and the patient's course of care are documented below.    Additional EKG obtained immediately upon presentation to the emergency department.  Also demonstrates what appears to be Mobitz 2 block with 2-1 conduction.  Also with a wide QRS complex at approximately 150.  Additional workup to include CBC, CMP, troponin, BNP, magnesium, TSH, chest x-ray.  Repeat EKG obtained after cardiac monitor concerning for potential third-degree block.  Repeat EKG with multiple nonconducted P waves, confirming concern for third-degree block.  Labs notable for mild AKI with creatinine  of 1.2 from 0.9 several years ago.  Cardiology consulted for admission.  After, patient to be evaluated for permanent pacemaker placement in the morning.  Upon reassessment, vital signs remained stable aside from bradycardia with rates in the 30s to 40s.  Blood pressure remains hypertensive with systolics in the 160s 170s.  Patient remains asymptomatic.  Cardiology to admit patient for further workup.  Please see inpatient provider notes for further details.  Disposition:  I discussed the case with cardiology who graciously agreed to admit the patient to their service for continued care.   Clinical Impression:  1. Bradycardia     Rx / DC Orders ED Discharge Orders     None       The plan for this patient was discussed with Dr. Jodi Mourning, who voiced agreement and who oversaw evaluation and treatment of this patient.   Clinical Complexity A medically appropriate history, review of systems, and physical exam was performed.  My independent interpretations of EKG, labs, and radiology are documented in the ED course above.   Click here for ABCD2, HEART and other calculatorsREFRESH Note before signing   Patient's presentation is most consistent with acute presentation with potential threat to life or bodily function.  Medical Decision Making Amount and/or Complexity of Data Reviewed Labs: ordered. Radiology: ordered.  Risk Decision regarding hospitalization.    HPI/ROS      See MDM section for pertinent HPI and ROS. A complete ROS was performed with pertinent positives/negatives noted above.   Past Medical History:  Diagnosis Date   Anxiety     Past Surgical History:  Procedure Laterality Date   APPENDECTOMY        Physical Exam   Vitals:   09/06/22 1937 09/06/22 2030 09/06/22  2154 09/06/22 2300  BP: (!) 173/55 (!) 149/138 (!) 182/52 (!) 170/75  Pulse: (!) 35 (!) 37 (!) 34 (!) 36  Resp: 17 15 15  (!) 25  Temp: 97.9 F (36.6 C)  97.7 F (36.5 C)   TempSrc:    Oral   SpO2: 98% 99% 100% 97%  Weight:      Height:        Physical Exam Vitals and nursing note reviewed.  Constitutional:      General: She is not in acute distress.    Appearance: She is well-developed.  HENT:     Head: Normocephalic and atraumatic.  Eyes:     Conjunctiva/sclera: Conjunctivae normal.  Cardiovascular:     Rate and Rhythm: Normal rate and regular rhythm.     Heart sounds: No murmur heard. Pulmonary:     Effort: Pulmonary effort is normal. No respiratory distress.     Breath sounds: Normal breath sounds.  Abdominal:     Palpations: Abdomen is soft.     Tenderness: There is no abdominal tenderness.  Musculoskeletal:        General: No swelling.     Cervical back: Neck supple.  Skin:    General: Skin is warm and dry.     Capillary Refill: Capillary refill takes less than 2 seconds.  Neurological:     Mental Status: She is alert.  Psychiatric:        Mood and Affect: Mood normal.     Starleen Arms, MD Department of Emergency Medicine   Please note that this documentation was produced with the assistance of voice-to-text technology and may contain errors.    Dyanne Iha, MD 09/07/22 9528    Blane Ohara, MD 09/07/22 [id number]

## 2022-09-06 NOTE — H&P (Signed)
Cardiology Admission History and Physical   Patient ID: Holly Farmer MRN: [id number]; DOB: [birth year]   Admission date: 09/06/2022  PCP:  Dois Davenport, MD   [facility] Providers Cardiologist:  None        Chief Complaint:  Hheart block  Patient Profile:   Holly Farmer is a 77 y.o. female with a hx of hypothryoidism who is being seen 09/06/2022 for the evaluation of high grade AV block at the request of Dr. Jodi Mourning.   History of Present Illness:   Holly Farmer went to her PCP and was found to have LE swelling. He had an EKG that showed 2:1 AV block as well as high grade AV block. She notes some increased SOB. No PND/orthopnea. She's had some ankle swelling. She denies chest pain or pressure.  She smokes. No prior cardiovascular disease Here he is hypertensive 166/102 mmHg, he has received any medication. S/p 250 cc NS bolus BNP 346, cxray - no pulmonary edema TSH normal, was very hypothyroid in the past. Mild AKI crt 1.2  Mild anema Hgb 11  She was seen by Dr. Wyline Mood earlier this evening already consulted, presented with lower extremity swelling but noted to have 2-1 AV block, no dizziness or syncope.  Admitted for EP evaluation and possible pacemaker in the a.m. initial EKG showed 2-1 block, underlying right bundle branch block and left anterior fascicular block, PR interval is constant.  And subsequent EKG on 15 613 shows complete heart block TSH is within normal limits  Patient and daughter who is at bedside very anxious, patient does not understand much about her heart condition however explained in detail, daughter explained in detail.  She was very active until yesterday without any problems but does report some increased dyspnea on exertion but no chest pain no syncope no dizziness Lifelong smoker   Past Medical History:  Diagnosis Date   Anxiety     Past Surgical History:  Procedure Laterality Date   APPENDECTOMY       Medications Prior to  Admission: Prior to Admission medications   Medication Sig Start Date End Date Taking? Authorizing Provider  clobetasol cream (TEMOVATE) 0.05 % Apply 1 application topically 2 (two) times daily. 07/01/16   Ofilia Neas, PA-C  fluticasone (FLONASE) 50 MCG/ACT nasal spray Place 2 sprays into both nostrils daily. 11/06/16   [name], MD  levothyroxine (SYNTHROID, LEVOTHROID) 25 MCG tablet TAKE ONE TABLET BY MOUTH DAILY BEFORE BREAKFAST 07/01/17   [name], MD  levothyroxine (SYNTHROID, LEVOTHROID) 50 MCG tablet TAKE ONE TABLET BY MOUTH EVERY MORNING BEFORE BREAKFAST 05/06/17   [name], MD  PARoxetine (PAXIL) 20 MG tablet TAKE ONE TABLET BY MOUTH DAILY 09/28/17   Collie Siad A, MD  rosuvastatin (CRESTOR) 10 MG tablet Take 1 tablet (10 mg total) by mouth daily. 01/26/17   [name], MD  tiZANidine (ZANAFLEX) 2 MG tablet Take 1 tablet (2 mg total) by mouth every 8 (eight) hours as needed for muscle spasms. 08/28/14   [name], MD  valACYclovir (VALTREX) 1000 MG tablet TAKE 2 TABLETS (2,000 MG TOTAL) BY MOUTH 2 (TWO) TIMES DAILY. 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      Children: 2 children; 2  grandchildren.      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      Tobacco: daily smoker less than 1ppd.       Alcohol: socially.; once per month      Drugs: none      Exercise: no formal exercise.  Kayaking once per month; moves furniture a lot at work.         Social Determinants of Health   Financial Resource Strain: Not on file  Food Insecurity: Not on file  Transportation Needs: Not on file  Physical Activity: Not on file  Stress: Not on file  Social Connections: Not on file  Intimate Partner Violence: Not on file    Family History:   The patient's family history includes Arthritis in her mother; Atrial fibrillation in her mother.    ROS:  Please see the history of present illness.  All other ROS reviewed and negative.     Physical Exam/Data:   Vitals:   09/06/22 1855 09/06/22 1937 09/06/22 2030 09/06/22 2154  BP: (!) 178/63 (!) 173/55 (!) 149/138 (!) 182/52  Pulse: (!) 36 (!) 35 (!) 37 (!) 34  Resp: 13 17 15 15   Temp: 97.9 F (36.6 C) 97.9 F (36.6 C)  97.7 F (36.5 C)  TempSrc: Oral   Oral  SpO2: 98% 98% 99% 100%  Weight:      Height:       No intake or output data in the 24 hours ending 09/06/22 2211    09/06/2022    2:43 PM 01/26/2017    2:09 PM 11/06/2016   12:04 PM  Last 3 Weights  Weight (lbs) 114 lb 119 lb 122 lb  Weight (kg) 51.71 kg 53.978 kg 55.339 kg     Body mass index is 20.19 kg/m.  General:  Well nourished, well developed, in no acute distress HEENT: normal Neck: no JVD Vascular: No carotid bruits; Distal pulses 2+ bilaterally   Cardiac:  normal S1, S2; RRR; no murmur  Lungs:  clear to auscultation bilaterally, no wheezing, rhonchi or rales  Abd: soft, nontender, no hepatomegaly  Ext: no edema Musculoskeletal:  No deformities, BUE and BLE strength normal and equal Skin: warm and dry  Neuro:  CNs 2-12 intact, no focal abnormalities noted Psych:  Normal affect      Relevant CV Studies: -  Laboratory  Data:  High Sensitivity Troponin:   Recent Labs  Lab 09/06/22 1518  TROPONINIHS 15      Chemistry Recent Labs  Lab 09/06/22 1518  NA 138  K 4.0  CL 111  CO2 21*  GLUCOSE 92  BUN 32*  CREATININE 1.20*  CALCIUM 8.2*  MG 2.3  GFRNONAA 47*  ANIONGAP 6    Recent Labs  Lab  09/06/22 1518  PROT 5.7*  ALBUMIN 2.9*  AST 39  ALT 41  ALKPHOS 80  BILITOT 0.6   Lipids No results for input(s): "CHOL", "TRIG", "HDL", "LABVLDL", "LDLCALC", "CHOLHDL" in the last 168 hours. Hematology Recent Labs  Lab 09/06/22 1518  WBC 10.1  RBC 3.90  HGB 11.4*  HCT 35.0*  MCV 89.7  MCH 29.2  MCHC 32.6  RDW 13.7  PLT 237   Thyroid  Recent Labs  Lab 09/06/22 1518  TSH 2.719   BNP Recent Labs  Lab 09/06/22 1518  BNP 346.2*    DDimer No results for input(s): "DDIMER" in the last 168 hours.   Radiology/Studies:  DG Chest Portable 1 View  Result Date: 09/06/2022 CLINICAL DATA:  heart block EXAM: PORTABLE CHEST 1 VIEW COMPARISON:  None Available. FINDINGS: No pleural effusion. No pneumothorax. Normal cardiac and mediastinal contours. There is hazy opacity at the left lung base that could represent atelectasis or infection. No radiographically apparent displaced rib fractures. Chronic fracture of the left proximal humerus. Visualized upper abdomen is unremarkable. IMPRESSION: Hazy opacity at the left lung base could represent atelectasis or infection. Electronically Signed   By: Lorenza Cambridge M.D.   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 -recommend TTE -no signs of ACS -no need for temp pacing tonight, however she does have complete heart block with escape rhythm and heart rate in the upper 30s with good blood pressure room -She is warm and perfusing well but if she becomes hypotensive or any change in clinical status will activate Cath Lab for temporary pacemaker -can consult EP in the AM for PPM consideration    HTN - BP fluctuates - avoid AVN blocking agents - avoid norvasc with LE swelling - start spironolactone 12.5 mg daily - IV hydralazine for SBP >160 He reports she never had high blood pressure   LE edema -compression stockings - albumin is low; recommend protein shake TIDAC - BNP mild, on RA; cxray does not show pulmonary edema. No clinical signs of CHF.  On exam no signs of heart failure  HLD - continue crestor 10 mg daily   Cardiology team is primary, admitting patient for complete heart block  Risk Assessment/Risk Scores:          Code Status: Full Code  Severity of Illness: The appropriate patient status for this patient is INPATIENT. Inpatient status is judged to be reasonable and necessary in order to provide the required intensity of service to ensure the patient's safety. The patient's presenting symptoms, physical exam findings, and initial radiographic and laboratory data in the context of their chronic comorbidities is felt to place them at high risk for further clinical deterioration. Furthermore, it is not anticipated that the patient will be medically stable for discharge from the hospital within 2 midnights of admission.   * I certify that at the point of admission it is my clinical judgment that the patient will require inpatient hospital care spanning beyond 2 midnights from the point of admission due to high intensity of service, high risk for further deterioration and high frequency of surveillance required.*   For questions or updates, please contact [facility] Please consult [email or web address] for contact info under     Signed, Elmon Kirschner, MD  09/06/2022 10:11 PM

## 2022-09-06 NOTE — Consult Note (Signed)
Cardiology Consultation   Patient ID: DER FEI MRN: [id number]; DOB: May 05, 1945  Admit date: 09/06/2022 Date of Consult: 09/06/2022  PCP:  Dois Davenport, MD   [facility] Providers Cardiologist:  None        Patient Profile:   Holly Farmer is a 77 y.o. female with a hx of hypothryoidism who is being seen 09/06/2022 for the evaluation of high grade AV block at the request of Dr. Jodi Mourning.  History of Present Illness:   Ms. Zora went to her PCP and was found to have LE swelling. He had an EKG that showed 2:1 AV block as well as high grade AV block. She notes some increased SOB. No PND/orthopnea. She's had some ankle swelling. She denies chest pain or pressure.  She smokes. No prior cardiovascular disease Here he is hypertensive 166/102 mmHg, he has received any medication. S/p 250 cc NS bolus BNP 346, cxray - no pulmonary edema TSH normal, was very hypothyroid in the past. Mild AKI crt 1.2  Mild anema Hgb 11   Past Medical History:  Diagnosis Date   Anxiety     Past Surgical History:  Procedure Laterality Date   APPENDECTOMY       Home Medications:  Prior to Admission medications   Medication Sig Start Date End Date Taking? Authorizing Provider  clobetasol cream (TEMOVATE) 0.05 % Apply 1 application topically 2 (two) times daily. 07/01/16   Ofilia Neas, PA-C  fluticasone (FLONASE) 50 MCG/ACT nasal spray Place 2 sprays into both nostrils daily. 11/06/16   [name], MD  levothyroxine (SYNTHROID, LEVOTHROID) 25 MCG tablet TAKE ONE TABLET BY MOUTH DAILY BEFORE BREAKFAST 07/01/17   [name], MD  levothyroxine (SYNTHROID, LEVOTHROID) 50 MCG tablet TAKE ONE TABLET BY MOUTH EVERY MORNING BEFORE BREAKFAST 05/06/17   [name], MD  PARoxetine (PAXIL) 20 MG tablet TAKE ONE TABLET BY MOUTH DAILY 09/28/17   Collie Siad A, MD  rosuvastatin (CRESTOR) 10 MG tablet Take 1 tablet (10 mg total) by mouth daily. 01/26/17   [name], MD   tiZANidine (ZANAFLEX) 2 MG tablet Take 1 tablet (2 mg total) by mouth every 8 (eight) hours as needed for muscle spasms. 08/28/14   [name], MD  valACYclovir (VALTREX) 1000 MG tablet TAKE 2 TABLETS (2,000 MG TOTAL) BY MOUTH 2 (TWO) TIMES DAILY. 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      Children: 2 children; 2 grandchildren.      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      Tobacco: daily smoker less than 1ppd.       Alcohol: socially.; once per month      Drugs: none      Exercise: no formal exercise.  Kayaking once per month; moves furniture a lot at work.         Social Determinants of Health   Financial Resource Strain: Not on file  Food Insecurity: Not on file  Transportation Needs: Not on file  Physical Activity: Not on file  Stress: Not on file  Social Connections: Not on file  Intimate Partner Violence: Not on file    Family History:    Family History  Problem Relation Age of Onset   Atrial fibrillation Mother     Arthritis Mother      ROS:  Please see the history of present illness.   All other ROS reviewed and negative.     Physical Exam/Data:   Vitals:   09/06/22 1515 09/06/22 1530 09/06/22 1545 09/06/22 1710  BP: (!) 184/66 (!) 161/56 (!) 172/58 (!) 166/102  Pulse: (!) 36 (!) 36 (!) 37 (!) 42  Resp: 14 14 14 16   Temp:      TempSrc:      SpO2: 100% 100% 100% 98%  Weight:      Height:       No intake or output data in the 24 hours ending 09/06/22 1740    09/06/2022    2:43 PM 01/26/2017    2:09 PM 11/06/2016   12:04 PM  Last 3 Weights  Weight (lbs) 114 lb 119 lb 122 lb  Weight (kg) 51.71 kg 53.978 kg 55.339 kg     Body mass index is 20.19 kg/m.  General:  Well nourished, well developed, in no acute distress HEENT: normal Neck: no JVD Vascular: No carotid bruits; Distal pulses 2+ bilaterally Cardiac:  normal S1, S2; RRR; no murmur  Lungs:  clear to auscultation bilaterally, no wheezing, rhonchi or rales  Abd: soft, nontender, no hepatomegaly  Ext: trace ankle edema Musculoskeletal:  No deformities, BUE and BLE strength normal and equal Skin: warm and dry  Neuro:  CNs 2-12 intact, no focal abnormalities noted Psych:  Normal affect   EKG:  The EKG was personally reviewed and demonstrates:   2:1 AV Block; high grade AV block  Telemetry:  Telemetry was personally reviewed and demonstrates:   2:1 AV block  Relevant CV Studies: Pending  Laboratory Data:  High Sensitivity Troponin:   Recent Labs  Lab 09/06/22 1518  TROPONINIHS 15     Chemistry Recent Labs  Lab 09/06/22 1518  NA 138  K 4.0  CL 111  CO2 21*  GLUCOSE 92  BUN 32*  CREATININE 1.20*  CALCIUM 8.2*  MG 2.3  GFRNONAA 47*  ANIONGAP 6    Recent Labs  Lab 09/06/22 1518  PROT 5.7*  ALBUMIN 2.9*  AST 39  ALT 41  ALKPHOS 80  BILITOT 0.6   Lipids No results for input(s): "CHOL", "TRIG", "HDL", "LABVLDL", "LDLCALC", "CHOLHDL" in the last 168 hours.  Hematology Recent Labs  Lab 09/06/22 1518   WBC 10.1  RBC 3.90  HGB 11.4*  HCT 35.0*  MCV 89.7  MCH 29.2  MCHC 32.6  RDW 13.7  PLT 237   Thyroid  Recent Labs  Lab 09/06/22 1518  TSH 2.719    BNP Recent Labs  Lab 09/06/22 1518  BNP 346.2*    DDimer No results for input(s): "DDIMER" in the last 168 hours.   Radiology/Studies:  DG Chest Portable 1 View  Result Date: 09/06/2022 CLINICAL DATA:  heart block EXAM: PORTABLE CHEST 1 VIEW COMPARISON:  None Available. FINDINGS: No pleural effusion. No pneumothorax. Normal cardiac and mediastinal contours. There is hazy opacity at the left lung base that could represent atelectasis or infection. No radiographically apparent displaced rib fractures. Chronic fracture of the left proximal humerus. Visualized upper abdomen is unremarkable. IMPRESSION: Hazy opacity at the left lung base could represent atelectasis or infection. Electronically Signed   By: Lorenza Cambridge M.D.   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 No clinical signs of CHF.   HLD - continue crestor 10 mg daily     Risk Assessment/Risk Scores:       For questions or updates, please contact [facility] Please consult [email or web address] for contact info under    Signed, Maisie Fus, MD  09/06/2022 5:40 PM

## 2022-09-07 ENCOUNTER — Inpatient Hospital Stay (HOSPITAL_COMMUNITY): Payer: Medicare PPO

## 2022-09-07 ENCOUNTER — Other Ambulatory Visit (HOSPITAL_COMMUNITY): Payer: Medicare PPO

## 2022-09-07 DIAGNOSIS — I498 Other specified cardiac arrhythmias: Secondary | ICD-10-CM | POA: Diagnosis not present

## 2022-09-07 DIAGNOSIS — R609 Edema, unspecified: Secondary | ICD-10-CM | POA: Diagnosis not present

## 2022-09-07 DIAGNOSIS — I4430 Unspecified atrioventricular block: Secondary | ICD-10-CM

## 2022-09-07 DIAGNOSIS — I10 Essential (primary) hypertension: Secondary | ICD-10-CM | POA: Diagnosis not present

## 2022-09-07 LAB — BASIC METABOLIC PANEL
Anion gap: 10 (ref 5–15)
BUN: 25 mg/dL — ABNORMAL HIGH (ref 8–23)
CO2: 17 mmol/L — ABNORMAL LOW (ref 22–32)
Calcium: 7.5 mg/dL — ABNORMAL LOW (ref 8.9–10.3)
Chloride: 114 mmol/L — ABNORMAL HIGH (ref 98–111)
Creatinine, Ser: 1.04 mg/dL — ABNORMAL HIGH (ref 0.44–1.00)
GFR, Estimated: 55 mL/min — ABNORMAL LOW (ref >60)
Glucose, Bld: 96 mg/dL (ref 70–99)
Potassium: 4.1 mmol/L (ref 3.5–5.1)
Sodium: 141 mmol/L (ref 135–145)

## 2022-09-07 LAB — CBC
HCT: 32.6 % — ABNORMAL LOW (ref 36.0–46.0)
Hemoglobin: 10.6 g/dL — ABNORMAL LOW (ref 12.0–15.0)
MCH: 29.2 pg (ref 26.0–34.0)
MCHC: 32.5 g/dL (ref 30.0–36.0)
MCV: 89.8 fL (ref 80.0–100.0)
Platelets: 205 10*3/uL (ref 150–400)
RBC: 3.63 MIL/uL — ABNORMAL LOW (ref 3.87–5.11)
RDW: 13.7 % (ref 11.5–15.5)
WBC: 8.9 10*3/uL (ref 4.0–10.5)
nRBC: 0 % (ref 0.0–0.2)

## 2022-09-07 LAB — SURGICAL PCR SCREEN
MRSA, PCR: NEGATIVE
Staphylococcus aureus: NEGATIVE

## 2022-09-07 MED ORDER — LOSARTAN POTASSIUM 50 MG PO TABS
50.0000 mg | ORAL_TABLET | Freq: Every day | ORAL | Status: DC
  Administered 2022-09-08 – 2022-09-09 (×2): 50 mg via ORAL
  Filled 2022-09-07 (×2): qty 1

## 2022-09-07 MED ORDER — ONDANSETRON HCL 4 MG/2ML IJ SOLN
4.0000 mg | Freq: Once | INTRAMUSCULAR | Status: AC
  Administered 2022-09-07: 4 mg via INTRAVENOUS
  Filled 2022-09-07: qty 2

## 2022-09-07 MED ORDER — LEVOTHYROXINE SODIUM 100 MCG PO TABS
100.0000 ug | ORAL_TABLET | Freq: Every day | ORAL | Status: DC
  Administered 2022-09-08 – 2022-09-09 (×2): 100 ug via ORAL
  Filled 2022-09-07 (×2): qty 1

## 2022-09-07 MED ORDER — SODIUM CHLORIDE 0.9 % IV SOLN: INTRAVENOUS | Status: DC

## 2022-09-07 MED ORDER — SPIRONOLACTONE 25 MG PO TABS
25.0000 mg | ORAL_TABLET | Freq: Every day | ORAL | Status: DC
  Administered 2022-09-08 – 2022-09-09 (×2): 25 mg via ORAL
  Filled 2022-09-07 (×2): qty 1

## 2022-09-07 MED ORDER — ROSUVASTATIN CALCIUM 5 MG PO TABS
10.0000 mg | ORAL_TABLET | Freq: Every day | ORAL | Status: DC
  Administered 2022-09-08 – 2022-09-09 (×2): 10 mg via ORAL
  Filled 2022-09-07 (×3): qty 2

## 2022-09-07 MED ORDER — NICOTINE 21 MG/24HR TD PT24
21.0000 mg | MEDICATED_PATCH | Freq: Every day | TRANSDERMAL | Status: DC
  Administered 2022-09-07 – 2022-09-09 (×3): 21 mg via TRANSDERMAL
  Filled 2022-09-07 (×3): qty 1

## 2022-09-07 MED ORDER — PAROXETINE HCL 20 MG PO TABS: 20.0000 mg | ORAL_TABLET | Freq: Every day | ORAL | Status: DC

## 2022-09-07 MED ORDER — MUPIROCIN 2 % EX OINT: 1.0000 | TOPICAL_OINTMENT | Freq: Two times a day (BID) | CUTANEOUS | Status: DC

## 2022-09-07 MED ORDER — LEVOTHYROXINE SODIUM 75 MCG PO TABS
75.0000 ug | ORAL_TABLET | Freq: Every day | ORAL | Status: DC
  Administered 2022-09-07: 75 ug via ORAL
  Filled 2022-09-07: qty 1

## 2022-09-07 NOTE — ED Notes (Signed)
Shift report received from [facility], Assumed care of patient at this time.

## 2022-09-07 NOTE — Progress Notes (Signed)
Rounding Note    Patient Name: Holly Farmer Date of Encounter: 09/07/2022  [facility] Cardiologist: None , new Carolan Clines MD, MS  Subjective   She feels well this AM no issues. Family at the bedside  Interval Hx: NPO pending possible PPM this afternoon. Spoke with EP GFR 55  Inpatient Medications    Scheduled Meds:  cyanocobalamin  1,000 mcg Intramuscular Q30 days   levothyroxine  75 mcg Oral Q0600   PARoxetine  20 mg Oral Daily   rosuvastatin  10 mg Oral Daily   spironolactone  12.5 mg Oral Daily   Continuous Infusions:  sodium chloride 25 mL/hr at 09/07/22 1106   PRN Meds: hydrALAZINE   Vital Signs    Vitals:   09/07/22 0700 09/07/22 0800 09/07/22 0823 09/07/22 0941  BP: (!) 176/67 (!) 161/50  (!) 185/54  Pulse: (!) 38 (!) 32  79  Resp: 20 18  16   Temp:    (!) 97.5 F (36.4 C)  TempSrc:      SpO2: 97% 96% 94% 100%  Weight:      Height:       No intake or output data in the 24 hours ending 09/07/22 1125    09/06/2022    2:43 PM 01/26/2017    2:09 PM 11/06/2016   12:04 PM  Last 3 Weights  Weight (lbs) 114 lb 119 lb 122 lb  Weight (kg) 51.71 kg 53.978 kg 55.339 kg      Telemetry    2:1/high grade AV block - Personally Reviewed  ECG    Predominant 2:1 AV block - Personally Reviewed  Physical Exam   Vitals:   09/07/22 0823 09/07/22 0941  BP:  (!) 185/54  Pulse:  79  Resp:  16  Temp:  (!) 97.5 F (36.4 C)  SpO2: 94% 100%    GEN: No acute distress.   Neck: No JVD Cardiac: RRR, no murmurs, rubs, or gallops.  Respiratory: Clear to auscultation bilaterally. GI: Soft, nontender, non-distended  MS: No edema; No deformity. Neuro:  Nonfocal  Psych: Normal affect   Labs    High Sensitivity Troponin:   Recent Labs  Lab 09/06/22 1518  TROPONINIHS 15     Chemistry Recent Labs  Lab 09/06/22 1518 09/07/22 0900  NA 138 141  K 4.0 4.1  CL 111 114*  CO2 21* 17*  GLUCOSE 92 96  BUN 32* 25*  CREATININE 1.20* 1.04*   CALCIUM 8.2* 7.5*  MG 2.3  --   PROT 5.7*  --   ALBUMIN 2.9*  --   AST 39  --   ALT 41  --   ALKPHOS 80  --   BILITOT 0.6  --   GFRNONAA 47* 55*  ANIONGAP 6 10    Lipids No results for input(s): "CHOL", "TRIG", "HDL", "LABVLDL", "LDLCALC", "CHOLHDL" in the last 168 hours.  Hematology Recent Labs  Lab 09/06/22 1518 09/07/22 0522  WBC 10.1 8.9  RBC 3.90 3.63*  HGB 11.4* 10.6*  HCT 35.0* 32.6*  MCV 89.7 89.8  MCH 29.2 29.2  MCHC 32.6 32.5  RDW 13.7 13.7  PLT 237 205   Thyroid  Recent Labs  Lab 09/06/22 1518  TSH 2.719    BNP Recent Labs  Lab 09/06/22 1518  BNP 346.2*    DDimer No results for input(s): "DDIMER" in the last 168 hours.   Radiology    DG Chest Portable 1 View  Result Date: 09/06/2022 CLINICAL DATA:  heart block  EXAM: PORTABLE CHEST 1 VIEW COMPARISON:  None Available. FINDINGS: No pleural effusion. No pneumothorax. Normal cardiac and mediastinal contours. There is hazy opacity at the left lung base that could represent atelectasis or infection. No radiographically apparent displaced rib fractures. Chronic fracture of the left proximal humerus. Visualized upper abdomen is unremarkable. IMPRESSION: Hazy opacity at the left lung base could represent atelectasis or infection. Electronically Signed   By: Lorenza Cambridge M.D.   On: 09/06/2022 16:14    Cardiac Studies   TTE pending  Patient Profile     Holly Farmer is a 77 y.o. 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 No prior ischemic symptoms. Trop neg. She does have CVD risk - EP has seen, possible plan for PPM   HTN - avoid AVN blocking agents - avoiding norvasc with ankle swelling - increase spironolactone 25 mg daily, start losartan 50 mg daily  LE edema -compression stockings -increase protein, low albumin  Tobacco  abuse -smoking cessation  For questions or updates, please contact [facility] Please consult [email or web address] for contact info under        Signed, Maisie Fus, MD  09/07/2022, 11:25 AM

## 2022-09-07 NOTE — ED Notes (Signed)
ED TO INPATIENT HANDOFF REPORT  ED Nurse Name and Phone #: Nehemiah Settle [phone]  S Name/Age/Gender Holly Farmer 77 y.o. female Room/Bed: 040C/040C  Code Status   Code Status: Full Code  Home/SNF/Other Home Patient oriented to: self, place, time, and situation Is this baseline? Yes   Triage Complete: Triage complete  Chief Complaint AV heart block [I44.30]  Triage Note Per EMS pt was at the doctors office for left ankle swelling, her primary was concerned of her bradycardia and sent her to be seen at ED.      Allergies No Known Allergies  Level of Care/Admitting Diagnosis ED Disposition     ED Disposition  Admit   Condition  --   Comment  Hospital Area: [facility] [[id number]]  Level of Care: Telemetry Cardiac [103]  May admit patient to [facility] or [facility] if equivalent level of care is available:: Yes  Covid Evaluation: Asymptomatic - no recent exposure (last 10 days) testing not required  Diagnosis: AV heart block [342080]  Admitting Physician: Elmon Kirschner [[id number]]  Attending Physician: Maisie Fus [phone]  Certification:: I certify this patient will need inpatient services for at least 2 midnights  Estimated Length of Stay: 4          B Medical/Surgery History Past Medical History:  Diagnosis Date   Anxiety    Past Surgical History:  Procedure Laterality Date   APPENDECTOMY       A IV Location/Drains/Wounds Patient Lines/Drains/Airways Status     Active Line/Drains/Airways     Name Placement date Placement time Site Days   Peripheral IV 09/06/22 20 G 1" Left;Posterior Forearm 09/06/22  1439  Forearm  1            Intake/Output Last 24 hours No intake or output data in the 24 hours ending 09/07/22 1341  Labs/Imaging Results for orders placed or performed during the hospital encounter of 09/06/22 (from the past 48 hour(s))  CBC with Differential     Status: Abnormal   Collection Time: 09/06/22  3:18 PM   Result Value Ref Range   WBC 10.1 4.0 - 10.5 K/uL   RBC 3.90 3.87 - 5.11 MIL/uL   Hemoglobin 11.4 (L) 12.0 - 15.0 g/dL   HCT 44.0 (L) 34.7 - 42.5 %   MCV 89.7 80.0 - 100.0 fL   MCH 29.2 26.0 - 34.0 pg   MCHC 32.6 30.0 - 36.0 g/dL   RDW 95.6 38.7 - 56.4 %   Platelets 237 150 - 400 K/uL   nRBC 0.0 0.0 - 0.2 %   Neutrophils Relative % 55 %   Neutro Abs 5.5 1.7 - 7.7 K/uL   Lymphocytes Relative 33 %   Lymphs Abs 3.3 0.7 - 4.0 K/uL   Monocytes Relative 8 %   Monocytes Absolute 0.8 0.1 - 1.0 K/uL   Eosinophils Relative 3 %   Eosinophils Absolute 0.3 0.0 - 0.5 K/uL   Basophils Relative 1 %   Basophils Absolute 0.1 0.0 - 0.1 K/uL   Immature Granulocytes 0 %   Abs Immature Granulocytes 0.03 0.00 - 0.07 K/uL    Comment: Performed at [facility], [address], [place], [place] [address]  Comprehensive metabolic panel     Status: Abnormal   Collection Time: 09/06/22  3:18 PM  Result Value Ref Range   Sodium 138 135 - 145 mmol/L   Potassium 4.0 3.5 - 5.1 mmol/L   Chloride 111 98 - 111 mmol/L  CO2 21 (L) 22 - 32 mmol/L   Glucose, Bld 92 70 - 99 mg/dL    Comment: Glucose reference range applies only to samples taken after fasting for at least 8 hours.   BUN 32 (H) 8 - 23 mg/dL   Creatinine, Ser 6.01 (H) 0.44 - 1.00 mg/dL   Calcium 8.2 (L) 8.9 - 10.3 mg/dL   Total Protein 5.7 (L) 6.5 - 8.1 g/dL   Albumin 2.9 (L) 3.5 - 5.0 g/dL   AST 39 15 - 41 U/L   ALT 41 0 - 44 U/L   Alkaline Phosphatase 80 38 - 126 U/L   Total Bilirubin 0.6 0.3 - 1.2 mg/dL   GFR, Estimated 47 (L) >60 mL/min    Comment: (NOTE) Calculated using the CKD-EPI Creatinine Equation (2021)    Anion gap 6 5 - 15    Comment: Performed at [facility], [address] [place], [place] [address]  Magnesium     Status: None   Collection Time: 09/06/22  3:18 PM  Result Value Ref Range   Magnesium 2.3 1.7 - 2.4 mg/dL    Comment: Performed at [facility], [address], [place], [place] [address]  TSH      Status: None   Collection Time: 09/06/22  3:18 PM  Result Value Ref Range   TSH 2.719 0.350 - 4.500 uIU/mL    Comment: Performed by a 3rd Generation assay with a functional sensitivity of <=0.01 uIU/mL. Performed at [facility], [address] [place], [place] [address]   Brain natriuretic peptide     Status: Abnormal   Collection Time: 09/06/22  3:18 PM  Result Value Ref Range   B Natriuretic Peptide 346.2 (H) 0.0 - 100.0 pg/mL    Comment: Performed at [facility], [address] [place], [place] [address]  Troponin I (High Sensitivity)     Status: None   Collection Time: 09/06/22  3:18 PM  Result Value Ref Range   Troponin I (High Sensitivity) 15 <18 ng/L    Comment: (NOTE) Elevated high sensitivity troponin I (hsTnI) values and significant  changes across serial measurements may suggest ACS but many other  chronic and acute conditions are known to elevate hsTnI results.  Refer to the "Links" section for chest pain algorithms and additional  guidance. Performed at [facility], [address] [place], [place] [address]   CBC     Status: Abnormal   Collection Time: 09/07/22  5:22 AM  Result Value Ref Range   WBC 8.9 4.0 - 10.5 K/uL   RBC 3.63 (L) 3.87 - 5.11 MIL/uL   Hemoglobin 10.6 (L) 12.0 - 15.0 g/dL   HCT 31.5 (L) 17.6 - 16.0 %   MCV 89.8 80.0 - 100.0 fL   MCH 29.2 26.0 - 34.0 pg   MCHC 32.5 30.0 - 36.0 g/dL   RDW 73.7 10.6 - 26.9 %   Platelets 205 150 - 400 K/uL   nRBC 0.0 0.0 - 0.2 %    Comment: Performed at [facility], [address], [place], [place] [address]  Basic metabolic panel     Status: Abnormal   Collection Time: 09/07/22  9:00 AM  Result Value Ref Range   Sodium 141 135 - 145 mmol/L   Potassium 4.1 3.5 - 5.1 mmol/L   Chloride 114 (H) 98 - 111 mmol/L   CO2 17 (L) 22 - 32 mmol/L   Glucose, Bld 96  70 - 99 mg/dL    Comment: Glucose reference range applies only to samples taken after fasting for at least 8 hours.   BUN  25 (H) 8 - 23 mg/dL   Creatinine, Ser 0.27 (H) 0.44 - 1.00 mg/dL   Calcium 7.5 (L) 8.9 - 10.3 mg/dL   GFR, Estimated 55 (L) >60 mL/min    Comment: (NOTE) Calculated using the CKD-EPI Creatinine Equation (2021)    Anion gap 10 5 - 15    Comment: Performed at [facility], [address] [place], [place] [address]   DG Chest Portable 1 View  Result Date: 09/06/2022 CLINICAL DATA:  heart block EXAM: PORTABLE CHEST 1 VIEW COMPARISON:  None Available. FINDINGS: No pleural effusion. No pneumothorax. Normal cardiac and mediastinal contours. There is hazy opacity at the left lung base that could represent atelectasis or infection. No radiographically apparent displaced rib fractures. Chronic fracture of the left proximal humerus. Visualized upper abdomen is unremarkable. IMPRESSION: Hazy opacity at the left lung base could represent atelectasis or infection. Electronically Signed   By: Lorenza Cambridge M.D.   On: 09/06/2022 16:14    Pending Labs Unresulted Labs (From admission, onward)    None       Vitals/Pain Today's Vitals   09/07/22 0800 09/07/22 0823 09/07/22 0941 09/07/22 1230  BP: (!) 161/50  (!) 185/54 (!) 183/67  Pulse: (!) 32  79 (!) 39  Resp: 18  16 20   Temp:   (!) 97.5 F (36.4 C)   TempSrc:      SpO2: 96% 94% 100% 98%  Weight:      Height:      PainSc:        Isolation Precautions No active isolations  Medications Medications  hydrALAZINE (APRESOLINE) injection 10 mg (has no administration in time range)  rosuvastatin (CRESTOR) tablet 10 mg (10 mg Oral Not Given 09/07/22 1111)  PARoxetine (PAXIL) tablet 20 mg ( Oral Canceled Entry 09/07/22 1059)  levothyroxine (SYNTHROID) tablet 75 mcg (75 mcg Oral Given 09/07/22 0645)  0.9 %  sodium chloride infusion ( Intravenous New Bag/Given 09/07/22 1106)  spironolactone (ALDACTONE) tablet 25 mg (has no administration in time range)  losartan (COZAAR) tablet 50 mg (has no administration in time range)  sodium chloride 0.9 %  bolus 250 mL (0 mLs Intravenous Stopped 09/06/22 1857)    Mobility walks     Focused Assessments    R Recommendations: See Admitting Provider Note  Report given to:   Additional Notes:  Procedure tomorrow, NPO@MN

## 2022-09-07 NOTE — ED Notes (Signed)
Pt ambulated to bathroom with steady gait. 

## 2022-09-07 NOTE — Progress Notes (Signed)
Pt was nauseated & vomiting, also very anxious (stated that she smokes a pack a day). Requested nausea medication & Nicotine patch; attempted to notify Cardiology MD on-call. Will continue to monitor.  Bari Edward, RN

## 2022-09-07 NOTE — Consult Note (Signed)
ELECTROPHYSIOLOGY CONSULT NOTE    Patient ID: Holly Farmer MRN: [id number], DOB/AGE: 77-Jul-1947 77 y.o.  Admit date: 09/06/2022 Date of Consult: 09/07/2022  Primary Physician: Dois Davenport, MD Primary Cardiologist: None  Electrophysiologist: New   Referring Provider: Dr. Wyline Mood  Patient Profile: Holly Farmer is a 77 y.o. female with a history of hypothyroidism who is being seen today for the evaluation of Advanced AV block at the request of Dr. Wyline Mood.  HPI:  Holly Farmer is a 77 y.o. female with medical history as above.   Pt was seen by PCP for LE edema and found to have new 2:1 AV block with high grade AV block at times. She has also had SOB. Denies PND, chest pain, or orthopnea.  These symptoms were new starting this Sunday.  Denies h/o syncope.  Only significant medical history is hypothyroidism. TSH 2.719 today.   Echo pending.   Labs Potassium4.0 (08/12 1518) Magnesium  2.3 (08/12 1518) Creatinine, ser  1.20* (08/12 1518) PLT  205 (08/13 0522) HGB  10.6* (08/13 0522) WBC 8.9 (08/13 0522) Troponin I (High Sensitivity)15 (08/12 1518).    Past Medical History:  Diagnosis Date   Anxiety      Surgical History:  Past Surgical History:  Procedure Laterality Date   APPENDECTOMY       (Not in a hospital admission)   Inpatient Medications:   cyanocobalamin  1,000 mcg Intramuscular Q30 days   levothyroxine  75 mcg Oral Q0600   PARoxetine  20 mg Oral Daily   rosuvastatin  10 mg Oral Daily   spironolactone  12.5 mg Oral Daily    Allergies: No Known Allergies  Family History  Problem Relation Age of Onset   Atrial fibrillation Mother    Arthritis Mother      Physical Exam: Vitals:   09/07/22 0644 09/07/22 0700 09/07/22 0800 09/07/22 0823  BP: (!) 157/57 (!) 176/67 (!) 161/50   Pulse: (!) 40 (!) 38 (!) 32   Resp: 16 20 18    Temp:      TempSrc:      SpO2: 95% 97% 96% 94%  Weight:      Height:        GEN- NAD, A&O x 3, normal  affect HEENT: Normocephalic, atraumatic Lungs- CTAB, Normal effort.  Heart- Regular rate and rhythm, No M/G/R.  GI- Soft, NT, ND.  Extremities- No clubbing, cyanosis, or edema   Radiology/Studies: DG Chest Portable 1 View  Result Date: 09/06/2022 CLINICAL DATA:  heart block EXAM: PORTABLE CHEST 1 VIEW COMPARISON:  None Available. FINDINGS: No pleural effusion. No pneumothorax. Normal cardiac and mediastinal contours. There is hazy opacity at the left lung base that could represent atelectasis or infection. No radiographically apparent displaced rib fractures. Chronic fracture of the left proximal humerus. Visualized upper abdomen is unremarkable. IMPRESSION: Hazy opacity at the left lung base could represent atelectasis or infection. Electronically Signed   By: Lorenza Cambridge M.D.   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  HS trop WNL.  Echo pending Explained risks, benefits, and alternatives to PPM implantation, including but not limited to bleeding, infection, pneumothorax, pericardial effusion, lead dislodgement, heart attack, stroke, or death.  Pt verbalized understanding and agrees to proceed pending lab availability.  Keep NPO until lunch  for decision today, more likely will go tomorrow.    HTN Elevated Avoid AV nodal agents until s/p pacing.  Continue spironolactone 12.5 mg daily IV hydralazine for SBP >160   LE edema Compression stockings BNP mild, on RA; cxray does not show pulmonary edema. No clinical signs of CHF.  Echo pending -> follow post pacing.    Dr. Nelly Laurence to see. 2:1 AV block with thus far no reversible cause and will require pacing. Echo pending for final recommendations. If EF down, will need ischemic work up.   For questions or updates, please contact [facility] Please consult [email or web address] for contact info under Cardiology/STEMI.  Dustin Flock, PA-C  09/07/2022 9:04 AM

## 2022-09-07 NOTE — Progress Notes (Incomplete)
  Patient Name: Holly Farmer Date of Encounter: ***  Primary Cardiologist: None Electrophysiologist: New  Interval Summary   ***  Inpatient Medications    Scheduled Meds:  [START ON 09/08/2022] levothyroxine  100 mcg Oral Q0600   losartan  50 mg Oral Daily   mupirocin ointment  1 Application Nasal BID   nicotine  21 mg Transdermal Daily   rosuvastatin  10 mg Oral Daily   [START ON 09/08/2022] spironolactone  25 mg Oral Daily   Continuous Infusions:  [START ON 09/08/2022] sodium chloride     PRN Meds: hydrALAZINE   Vital Signs    Vitals:   09/07/22 1528 09/07/22 1835 09/07/22 1901 09/07/22 1950  BP: (!) 167/84 (!) 190/66 (!) 175/62 (!) 179/58  Pulse:    68  Resp:    16  Temp:    98.1 F (36.7 C)  TempSrc:    Oral  SpO2:    100%  Weight:      Height:       No intake or output data in the 24 hours ending 09/07/22 2132 Filed Weights   09/06/22 1443  Weight: 51.7 kg    Physical Exam    GEN- The patient is well appearing, alert and oriented x 3 today.   Lungs- Clear to ausculation bilaterally, normal work of breathing Cardiac- {Blank single:19197::"Regular","Irregularly irregular"} rate and rhythm, no murmurs, rubs or gallops GI- soft, NT, ND, + BS Extremities- no clubbing or cyanosis. No edema  Telemetry    2:1 AV block with intermittent advanced AV block (3:1) (personally reviewed)  Hospital Course    Holly Farmer is a 77 y.o. female with a history of hypothyroidism who is being seen today for the evaluation of Advanced AV block at the request of Dr. Wyline Mood.   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  HS trop WNL.  Echo *** Explained risks, benefits, and alternatives to PPM implantation, including but not limited to bleeding, infection, pneumothorax, pericardial effusion, lead dislodgement, heart attack, stroke, or death.  Pt verbalized understanding and agrees to proceed pending lab availability.  ***     HTN Avoid AV nodal agents until s/p pacing.  Continue spironolactone 12.5 mg daily IV hydralazine for SBP >160   LE edema Compression stockings BNP mild, on RA; cxray does not show pulmonary edema. No clinical signs of CHF.  Echo ***   Tobacco abuse Encouraged cessation, especially perioperatively.   For questions or updates, please contact [facility] Please consult [email or web address] for contact info under Cardiology/STEMI.  Signed, Graciella Freer, PA-C  09/07/2022, 9:32 PM

## 2022-09-07 NOTE — ED Notes (Signed)
Was requested by daughter as patient's monitor was flashing per her, and she could not find any staff in yellow zone. I found patient in NAD in bed, with monitor blinking due to bradycardia, with a heart rate in the 30s, with AVB noted on monitor. BP and mental status normal. I advised I wished to take an EKG as I was not sure that this was the same rhythm patient had been in or not. When I went to place secondary leads, patient and daughter became upset, and stated that they wanted the nurse. 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 They were unsatisfied and still upset, so I looked for nurse for yellow zone, unable to find one. Charge RN notified immediately.

## 2022-09-07 NOTE — Progress Notes (Signed)
   09/07/22 1950  Assess: MEWS Score  Temp 98.1 F (36.7 C)  BP (!) 179/58  MAP (mmHg) 92  Pulse Rate 68  ECG Heart Rate (!) 32  Resp 16  Level of Consciousness Alert  SpO2 100 %  O2 Device Room Air  Assess: MEWS Score  MEWS Temp 0  MEWS Systolic 0  MEWS Pulse 2  MEWS RR 0  MEWS LOC 0  MEWS Score 2  MEWS Score Color Yellow  Assess: if the MEWS score is Yellow or Red  Were vital signs accurate and taken at a resting state? Yes  Does the patient meet 2 or more of the SIRS criteria? No  MEWS guidelines implemented  Yes, yellow  Treat  MEWS Interventions Considered administering scheduled or prn medications/treatments as ordered  Take Vital Signs  Increase Vital Sign Frequency  Yellow: Q2hr x1, continue Q4hrs until patient remains green for 12hrs  Escalate  MEWS: Escalate Yellow: Discuss with charge nurse and consider notifying provider and/or RRT  Notify: Charge Nurse/RN  Name of Charge Nurse/RN Notified Christina, RN  Assess: SIRS CRITERIA  SIRS Temperature  0  SIRS Pulse 0  SIRS Respirations  0  SIRS WBC 0  SIRS Score Sum  0

## 2022-09-07 NOTE — ED Notes (Signed)
PT ambulated to the bathroom with no difficulty

## 2022-09-08 ENCOUNTER — Other Ambulatory Visit: Payer: Self-pay

## 2022-09-08 ENCOUNTER — Inpatient Hospital Stay (HOSPITAL_COMMUNITY)
Admission: EM | Disposition: A | Discharge status: Home / Self Care | Payer: Self-pay | Source: Ambulatory Visit | Attending: Internal Medicine

## 2022-09-08 DIAGNOSIS — I442 Atrioventricular block, complete: Secondary | ICD-10-CM | POA: Diagnosis not present

## 2022-09-08 HISTORY — PX: PACEMAKER IMPLANT: EP1218

## 2022-09-08 SURGERY — PACEMAKER IMPLANT

## 2022-09-08 MED ORDER — IOHEXOL 350 MG/ML SOLN
INTRAVENOUS | Status: DC | PRN
  Administered 2022-09-08 (×2): 10 mL

## 2022-09-08 MED ORDER — SODIUM CHLORIDE 0.9 % IV SOLN
80.0000 mg | INTRAVENOUS | Status: AC
  Administered 2022-09-08: 80 mg
  Filled 2022-09-08: qty 2

## 2022-09-08 MED ORDER — LIDOCAINE HCL 1 % IJ SOLN
INTRAMUSCULAR | Status: AC
  Filled 2022-09-08: qty 40

## 2022-09-08 MED ORDER — CEFAZOLIN SODIUM-DEXTROSE 2-4 GM/100ML-% IV SOLN
2.0000 g | INTRAVENOUS | Status: AC
  Administered 2022-09-08: 2 g via INTRAVENOUS
  Filled 2022-09-08: qty 100

## 2022-09-08 MED ORDER — SODIUM CHLORIDE 0.9 % IV SOLN: INTRAVENOUS | Status: DC

## 2022-09-08 MED ORDER — FUROSEMIDE 10 MG/ML IJ SOLN
INTRAMUSCULAR | Status: AC
  Filled 2022-09-08: qty 4

## 2022-09-08 MED ORDER — ACETAMINOPHEN 325 MG PO TABS
325.0000 mg | ORAL_TABLET | ORAL | Status: DC | PRN
  Administered 2022-09-08 – 2022-09-09 (×2): 650 mg via ORAL
  Filled 2022-09-08 (×2): qty 2

## 2022-09-08 MED ORDER — FENTANYL CITRATE (PF) 100 MCG/2ML IJ SOLN
INTRAMUSCULAR | Status: AC
  Filled 2022-09-08: qty 2

## 2022-09-08 MED ORDER — CEFAZOLIN SODIUM-DEXTROSE 1-4 GM/50ML-% IV SOLN
1.0000 g | Freq: Four times a day (QID) | INTRAVENOUS | Status: AC
  Administered 2022-09-08 – 2022-09-09 (×3): 1 g via INTRAVENOUS
  Filled 2022-09-08 (×3): qty 50

## 2022-09-08 MED ORDER — CHLORHEXIDINE GLUCONATE 4 % EX SOLN
60.0000 mL | Freq: Once | CUTANEOUS | Status: AC
  Administered 2022-09-08: 4 via TOPICAL

## 2022-09-08 MED ORDER — MIDAZOLAM HCL 5 MG/5ML IJ SOLN
INTRAMUSCULAR | Status: AC
  Filled 2022-09-08: qty 5

## 2022-09-08 MED ORDER — DIAZEPAM 2 MG PO TABS
2.0000 mg | ORAL_TABLET | Freq: Once | ORAL | Status: AC
  Administered 2022-09-08: 2 mg via ORAL
  Filled 2022-09-08: qty 1

## 2022-09-08 MED ORDER — LABETALOL HCL 5 MG/ML IV SOLN
INTRAVENOUS | Status: DC | PRN
  Administered 2022-09-08 (×2): 20 mg via INTRAVENOUS

## 2022-09-08 MED ORDER — LABETALOL HCL 5 MG/ML IV SOLN
INTRAVENOUS | Status: AC
  Filled 2022-09-08: qty 4

## 2022-09-08 MED ORDER — FENTANYL CITRATE (PF) 100 MCG/2ML IJ SOLN
INTRAMUSCULAR | Status: DC | PRN
  Administered 2022-09-08 (×2): 25 ug via INTRAVENOUS

## 2022-09-08 MED ORDER — CEFAZOLIN SODIUM-DEXTROSE 2-4 GM/100ML-% IV SOLN
INTRAVENOUS | Status: AC
  Filled 2022-09-08: qty 100

## 2022-09-08 MED ORDER — ONDANSETRON HCL 4 MG/2ML IJ SOLN: 4.0000 mg | Freq: Four times a day (QID) | INTRAMUSCULAR | Status: DC | PRN

## 2022-09-08 MED ORDER — FUROSEMIDE 10 MG/ML IJ SOLN
INTRAMUSCULAR | Status: DC | PRN
  Administered 2022-09-08: 20 mg via INTRAVENOUS

## 2022-09-08 MED ORDER — SODIUM CHLORIDE 0.9 % IV SOLN
INTRAVENOUS | Status: AC
  Filled 2022-09-08: qty 2

## 2022-09-08 MED ORDER — CHLORHEXIDINE GLUCONATE 4 % EX SOLN: 60.0000 mL | Freq: Once | CUTANEOUS | Status: DC

## 2022-09-08 MED ORDER — OXYCODONE HCL 5 MG PO TABS
5.0000 mg | ORAL_TABLET | Freq: Once | ORAL | Status: AC | PRN
  Administered 2022-09-08: 5 mg via ORAL
  Filled 2022-09-08: qty 1

## 2022-09-08 MED ORDER — MIDAZOLAM HCL 5 MG/5ML IJ SOLN
INTRAMUSCULAR | Status: DC | PRN
  Administered 2022-09-08: 2 mg via INTRAVENOUS
  Administered 2022-09-08: 1 mg via INTRAVENOUS

## 2022-09-08 SURGICAL SUPPLY — 12 items
CABLE SURGICAL S-101-97-12 (CABLE) ×1 IMPLANT
CATH RIGHTSITE C315HIS02 (CATHETERS) IMPLANT
KIT MICROPUNCTURE NIT STIFF (SHEATH) IMPLANT
LEAD SELECT SECURE 3830 383069 (Lead) IMPLANT
LEAD ULTIPACE 52 LPA1231/52 (Lead) IMPLANT
PACEMAKER ASSURITY DR-RF (Pacemaker) IMPLANT
PAD DEFIB RADIO PHYSIO CONN (PAD) ×1 IMPLANT
SELECT SECURE 3830 383069 (Lead) ×1 IMPLANT
SHEATH 7FR PRELUDE SNAP 13 (SHEATH) IMPLANT
SLITTER 6232ADJ (MISCELLANEOUS) IMPLANT
TRAY PACEMAKER INSERTION (PACKS) ×1 IMPLANT
WIRE HI TORQ VERSACORE-J 145CM (WIRE) IMPLANT

## 2022-09-08 NOTE — Progress Notes (Addendum)
  Patient Name: Holly Farmer Date of Encounter: 09/08/2022  Primary Cardiologist: None Electrophysiologist: None  Interval Summary   Very anxious overnight but feeling OK this am.   Inpatient Medications    Scheduled Meds:  levothyroxine  100 mcg Oral Q0600   losartan  50 mg Oral Daily   nicotine  21 mg Transdermal Daily   rosuvastatin  10 mg Oral Daily   spironolactone  25 mg Oral Daily   Continuous Infusions:  sodium chloride 50 mL/hr at 09/08/22 0810   PRN Meds: hydrALAZINE   Vital Signs    Vitals:   09/07/22 1950 09/07/22 2320 09/08/22 0305 09/08/22 0749  BP: (!) 179/58 (!) 165/55 (!) 140/72 (!) 154/60  Pulse: 68 (!) 109 (!) 30 (!) 53  Resp: 16 17 18 18   Temp: 98.1 F (36.7 C) 98.6 F (37 C) 98 F (36.7 C) 97.8 F (36.6 C)  TempSrc: Oral Oral Oral Oral  SpO2: 100% 99% 93% 100%  Weight:      Height:       No intake or output data in the 24 hours ending 09/08/22 0934 Filed Weights   09/06/22 1443  Weight: 51.7 kg    Physical Exam    GEN- The patient is well appearing, alert and oriented x 3 today.   Lungs- Clear to ausculation bilaterally, normal work of breathing Cardiac-  Slow  rate and rhythm, no murmurs, rubs or gallops GI- soft, NT, ND, + BS Extremities- no clubbing or cyanosis. No edema  Telemetry    Advanced AV block with 2:1 and 3:1 AV block and HR in 30s (personally reviewed)  Hospital Course    Holly Farmer is a 77 y.o. female with a history of hypothyroidism who is being seen for the evaluation of Advanced AV block at the request of Dr. Wyline Mood.   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  HS trop WNL.  Echo done, read pending.  Explained risks, benefits, and alternatives to PPM implantation, including but not limited to bleeding, infection, pneumothorax, pericardial effusion, lead dislodgement, heart attack, stroke, or death.  Pt verbalized understanding and agrees to proceed.    HTN Continue to avoid AV nodal agents until s/p pacing.  Continue spironolactone 12.5 mg daily IV hydralazine for SBP >160   LE edema Compression stockings BNP mild, on RA; cxray does not show pulmonary edema. No clinical signs of CHF.  Echo done, read pending.   For questions or updates, please contact [facility] Please consult [email or web address] for contact info under Cardiology/STEMI.  Signed, Graciella Freer, PA-C  09/08/2022, 9:34 AM

## 2022-09-08 NOTE — TOC CM/SW Note (Signed)
Transition of Care [facility]) - Inpatient Brief Assessment   Patient Details  Name: Holly Farmer MRN: [id number] Date of Birth: [birth year]  [facility]) CM/SW Contact:    Gala Lewandowsky, RN Phone Number: 09/08/2022, 11:20 AM   Clinical Narrative: Patient presented for AV Block-plan for pacemaker implant today. Case Manager will continue to follow for transition of care needs as the patient progresses.    Transition of Care Asessment: Insurance and Status: Insurance coverage has been reviewed Patient has primary care physician: Yes Prior/Current Home Services: No current home services Social Determinants of Health Reivew: SDOH reviewed no interventions necessary Readmission risk has been reviewed: Yes Transition of care needs: transition of care needs identified, TOC will continue to follow

## 2022-09-08 NOTE — Progress Notes (Signed)
Pt was complaining of 6/10 pain at [facility] insertion site; Tylenol previously given with no relief. Attempted to notify Kansal, MD. Awaiting further orders.  Bari Edward, RN

## 2022-09-08 NOTE — Plan of Care (Signed)

## 2022-09-09 ENCOUNTER — Encounter (HOSPITAL_COMMUNITY): Payer: Self-pay | Admitting: Internal Medicine

## 2022-09-09 ENCOUNTER — Inpatient Hospital Stay (HOSPITAL_COMMUNITY): Payer: Medicare PPO

## 2022-09-09 ENCOUNTER — Other Ambulatory Visit (HOSPITAL_COMMUNITY): Payer: Self-pay

## 2022-09-09 DIAGNOSIS — I4430 Unspecified atrioventricular block: Secondary | ICD-10-CM | POA: Diagnosis not present

## 2022-09-09 MED ORDER — LOSARTAN POTASSIUM 50 MG PO TABS
50.0000 mg | ORAL_TABLET | Freq: Every day | ORAL | 6 refills | Status: DC
  Filled 2022-09-09: qty 30, 30d supply, fill #0

## 2022-09-09 MED FILL — Lidocaine HCl Local Preservative Free (PF) Inj 1%: INTRAMUSCULAR | Qty: 30 | Status: AC

## 2022-09-09 NOTE — Discharge Instructions (Signed)
After Your Pacemaker   You have a Abbott Pacemaker  ACTIVITY Do not lift your arm above shoulder height for 1 week after your procedure. After 7 days, you may progress as below.  You should remove your sling 24 hours after your procedure, unless otherwise instructed by your provider.     Thursday September 16, 2022  Friday September 17, 2022 Saturday September 18, 2022 Sunday September 19, 2022   Do not lift, push, pull, or carry anything over 10 pounds with the affected arm until 6 weeks (Thursday October 21, 2022 ) after your procedure.   You may drive AFTER your wound check, unless you have been told otherwise by your provider.   Ask your healthcare provider when you can go back to work   INCISION/Dressing If you are on a blood thinner such as Coumadin, Xarelto, Eliquis, Plavix, or Pradaxa please confirm with your provider when this should be resumed.   If large square, outer bandage is left in place, this can be removed after 24 hours from your procedure. Do not remove steri-strips or glue as below.   If a PRESSURE DRESSING (a bulky dressing that usually goes up over your shoulder) was applied or left in place, please follow instructions given by your provider on when to return to have this removed.   Monitor your Pacemaker site for redness, swelling, and drainage. Call the device clinic at [phone] if you experience these symptoms or fever/chills.  If your incision is closed with Dermabond/Surgical glue. You may shower 1 day after your pacemaker implant and wash around the site with soap and water.    If you were discharged in a sling, please do not wear this during the day more than 48 hours after your surgery unless otherwise instructed. This may increase the risk of stiffness and soreness in your shoulder.   Avoid lotions, ointments, or perfumes over your incision until it is well-healed.  You may use a hot tub or a pool AFTER your wound check appointment if the incision is  completely closed.  Pacemaker Alerts:  Some alerts are vibratory and others beep. These are NOT emergencies. Please call our office to let us know. If this occurs at night or on weekends, it can wait until the next business day. Send a remote transmission.  If your device is capable of reading fluid status (for heart failure), you will be offered monthly monitoring to review this with you.   DEVICE MANAGEMENT Remote monitoring is used to monitor your pacemaker from home. This monitoring is scheduled every 91 days by our office. It allows [place] to keep an eye on the functioning of your device to ensure it is working properly. You will routinely see your Electrophysiologist annually (more often if necessary).   You should receive your ID card for your new device in 4-8 weeks. Keep this card with you at all times once received. Consider wearing a medical alert bracelet or necklace.  Your Pacemaker may be MRI compatible. This will be discussed at your next office visit/wound check.  You should avoid contact with strong electric or magnetic fields.   Do not use amateur (ham) radio equipment or electric (arc) welding torches. MP3 player headphones with magnets should not be used. Some devices are safe to use if held at least 12 inches (30 cm) from your Pacemaker. These include power tools, lawn mowers, and speakers. If you are unsure if something is safe to use, ask your health care provider.  When using  your cell phone, hold it to the ear that is on the opposite side from the Pacemaker. Do not leave your cell phone in a pocket over the Pacemaker.  You may safely use electric blankets, heating pads, computers, and microwave ovens.  Call the office right away if: You have chest pain. You feel more short of breath than you have felt before. You feel more light-headed than you have felt before. Your incision starts to open up.  This information is not intended to replace advice given to you by your  health care provider. Make sure you discuss any questions you have with your health care provider.

## 2022-09-09 NOTE — Plan of Care (Signed)
  Problem: Education: Goal: Knowledge of General Education information will improve Description: Including pain rating scale, medication(s)/side effects and non-pharmacologic comfort measures 09/09/2022 0953 by Herma Carson, RN Outcome: Adequate for Discharge 09/09/2022 0739 by Herma Carson, RN Outcome: Progressing   Problem: Health Behavior/Discharge Planning: Goal: Ability to manage health-related needs will improve 09/09/2022 0953 by Herma Carson, RN Outcome: Adequate for Discharge 09/09/2022 0739 by Herma Carson, RN Outcome: Progressing   Problem: Clinical Measurements: Goal: Ability to maintain clinical measurements within normal limits will improve 09/09/2022 0953 by Herma Carson, RN Outcome: Adequate for Discharge 09/09/2022 0739 by Herma Carson, RN Outcome: Progressing Goal: Will remain free from infection 09/09/2022 0953 by Herma Carson, RN Outcome: Adequate for Discharge 09/09/2022 0739 by Herma Carson, RN Outcome: Progressing Goal: Diagnostic test results will improve 09/09/2022 0953 by Herma Carson, RN Outcome: Adequate for Discharge 09/09/2022 0739 by Herma Carson, RN Outcome: Progressing Goal: Respiratory complications will improve 09/09/2022 0953 by Herma Carson, RN Outcome: Adequate for Discharge 09/09/2022 0739 by Herma Carson, RN Outcome: Progressing Goal: Cardiovascular complication will be avoided 09/09/2022 0953 by Herma Carson, RN Outcome: Adequate for Discharge 09/09/2022 0739 by Herma Carson, RN Outcome: Progressing   Problem: Activity: Goal: Risk for activity intolerance will decrease 09/09/2022 0953 by Herma Carson, RN Outcome: Adequate for Discharge 09/09/2022 0739 by Herma Carson, RN Outcome: Progressing   Problem: Nutrition: Goal: Adequate nutrition will be maintained 09/09/2022 0953 by Herma Carson, RN Outcome: Adequate for Discharge 09/09/2022 0739 by Herma Carson, RN Outcome: Progressing    Problem: Coping: Goal: Level of anxiety will decrease 09/09/2022 0953 by Herma Carson, RN Outcome: Adequate for Discharge 09/09/2022 0739 by Herma Carson, RN Outcome: Progressing   Problem: Elimination: Goal: Will not experience complications related to bowel motility 09/09/2022 0953 by Herma Carson, RN Outcome: Adequate for Discharge 09/09/2022 0739 by Herma Carson, RN Outcome: Progressing Goal: Will not experience complications related to urinary retention 09/09/2022 0953 by Herma Carson, RN Outcome: Adequate for Discharge 09/09/2022 0739 by Herma Carson, RN Outcome: Progressing   Problem: Pain Managment: Goal: General experience of comfort will improve 09/09/2022 0953 by Herma Carson, RN Outcome: Adequate for Discharge 09/09/2022 0739 by Herma Carson, RN Outcome: Progressing   Problem: Safety: Goal: Ability to remain free from injury will improve 09/09/2022 0953 by Herma Carson, RN Outcome: Adequate for Discharge 09/09/2022 0739 by Herma Carson, RN Outcome: Progressing   Problem: Skin Integrity: Goal: Risk for impaired skin integrity will decrease 09/09/2022 0953 by Herma Carson, RN Outcome: Adequate for Discharge 09/09/2022 0739 by Herma Carson, RN Outcome: Progressing   Problem: Education: Goal: Knowledge of cardiac device and self-care will improve 09/09/2022 0953 by Herma Carson, RN Outcome: Adequate for Discharge 09/09/2022 0739 by Herma Carson, RN Outcome: Progressing Goal: Ability to safely manage health related needs after discharge will improve 09/09/2022 0953 by Herma Carson, RN Outcome: Adequate for Discharge 09/09/2022 0739 by Herma Carson, RN Outcome: Progressing Goal: Individualized Educational Video(s) 09/09/2022 0953 by Herma Carson, RN Outcome: Adequate for Discharge 09/09/2022 0739 by Herma Carson, RN Outcome: Progressing   Problem: Cardiac: Goal: Ability to achieve and maintain adequate  cardiopulmonary perfusion will improve 09/09/2022 0953 by Herma Carson, RN Outcome: Adequate for Discharge 09/09/2022 0739 by Herma Carson, RN Outcome: Progressing

## 2022-09-09 NOTE — Discharge Summary (Signed)
ELECTROPHYSIOLOGY PROCEDURE DISCHARGE SUMMARY    Patient ID: Holly Farmer,  MRN: [id number], DOB/AGE: 1945-10-13 77 y.o.  Admit date: 09/06/2022 Discharge date: 09/09/2022  Primary Care Physician: Dois Davenport, MD  Primary Cardiologist: None  Electrophysiologist: Dr. Graciela Husbands   Primary Discharge Diagnosis:  Advanced AV block post pacemaker implantation this admission  Secondary Diagnosis HTN  No Known Allergies   Procedures This Admission:  1.  Implantation of a Abbott Dual Chamber PPM on 8/14 by Dr. Graciela Husbands. The patient received a Abbott Assurity [id number] with a Abbott Ultipace 1231-52 right atrial lead and a Medtronic SelectSure 3830 right ventricular lead.  There were no immediate post procedure complications.   2.  CXR on 09/09/2022 demonstrated no pneumothorax status post device implantation.       Brief HPI: Holly Farmer is a 77 y.o. female was admitted for symptomatic bradycardia and electrophysiology team asked to see for consideration of PPM implantation.  Past medical history includes hypothyroidism.  The patient has had AV block without reversible causes identified.  Risks, benefits, and alternatives to PPM implantation were reviewed with the patient who wished to proceed.   Hospital Course:  The patient was admitted and underwent implantation of a Abbott dual chamber PPM with details as outlined above.  She was monitored on telemetry overnight which demonstrated appropriate pacing.  Left chest was without hematoma or ecchymosis.  The device was interrogated and found to be functioning normally.  CXR was obtained and demonstrated no pneumothorax status post device implantation.  Wound care, arm mobility, and restrictions were reviewed with the patient.  The patient was examined and considered stable for discharge to home.    Her BP corrected some post pacing, but did have further elevated readings. She had not seen MD in quite some time and suspect had  undiagnosed HTN given history of smoking. Discharged on losartan.   Anticoagulation resumption This patient is not on anticoagulation     Physical Exam: Vitals:   09/08/22 2003 09/09/22 0023 09/09/22 0422 09/09/22 0812  BP: (!) 180/74 (!) 121/56 (!) 139/51 (!) 180/78  Pulse: 67  85 74  Resp: 18 18 18 18   Temp: 98 F (36.7 C) 98 F (36.7 C) 98.1 F (36.7 C) 98 F (36.7 C)  TempSrc: Oral Oral Oral Oral  SpO2: 98%  96% 96%  Weight:      Height:        GEN- NAD. A&O x 3.  HEENT: Normocephalic, atraumatic Lungs- CTAB, Normal effort.  Heart- RRR, No M/G/R.  GI- Soft, NT, ND.  Extremities- No clubbing, cyanosis, or edema;  Skin- warm and dry, no rash or lesion, left chest without hematoma/ecchymosis  Discharge Medications:  Allergies as of 09/09/2022   No Known Allergies      Medication List     TAKE these medications    clobetasol cream 0.05 % Commonly known as: TEMOVATE Apply 1 application topically 2 (two) times daily.   escitalopram 10 MG tablet Commonly known as: LEXAPRO Take 10 mg by mouth daily.   fluticasone 50 MCG/ACT nasal spray Commonly known as: FLONASE Place 2 sprays into both nostrils daily.   gabapentin 100 MG capsule Commonly known as: NEURONTIN Take 100 mg by mouth 2 (two) times daily.   levothyroxine 100 MCG tablet Commonly known as: SYNTHROID Take 100 mcg by mouth daily.   losartan 50 MG tablet Commonly known as: COZAAR Take 1 tablet (50 mg total) by mouth daily.   naproxen sodium 220  MG tablet Commonly known as: ALEVE Take 220 mg by mouth daily as needed.   PARoxetine 20 MG tablet Commonly known as: PAXIL TAKE ONE TABLET BY MOUTH DAILY   rosuvastatin 10 MG tablet Commonly known as: CRESTOR Take 1 tablet (10 mg total) by mouth daily.   tiZANidine 2 MG tablet Commonly known as: ZANAFLEX Take 1 tablet (2 mg total) by mouth every 8 (eight) hours as needed for muscle spasms.   valACYclovir 1000 MG tablet Commonly known as:  VALTREX TAKE 2 TABLETS (2,000 MG TOTAL) BY MOUTH 2 (TWO) TIMES DAILY. FOR ONE DAY ONLY        Disposition:    Follow-up Information     [facility] Follow up.   Specialty: Cardiology Why: on 8/29 at 2 pm for post pacemaker check Contact information: [address] [place] [address] [phone]                Duration of Discharge Encounter: Greater than 30 minutes including physician time.  Dustin Flock, PA-C  09/09/2022 9:01 AM

## 2022-09-09 NOTE — Plan of Care (Signed)

## 2022-09-23 ENCOUNTER — Ambulatory Visit: Payer: Medicare PPO | Attending: Cardiovascular Disease

## 2022-09-23 ENCOUNTER — Telehealth: Payer: Self-pay

## 2022-09-23 DIAGNOSIS — I4430 Unspecified atrioventricular block: Secondary | ICD-10-CM

## 2022-09-23 NOTE — Telephone Encounter (Signed)
Returned call to daughter.  Advised Pt to keep a log of her blood pressures-check 2-3 times a WEEK a few hours after AM meds and bring log to next appointment.  Daughter indicates understanding.

## 2022-09-23 NOTE — Telephone Encounter (Signed)
Transmission received.   The patient daughter has questions about her blood pressure? She forgot to asked at the appointment. Last night it was 135/95 bpm and today 119/86 at 3:38 pm. I told her I will let Boneta Lucks know.

## 2022-09-23 NOTE — Patient Instructions (Signed)
   After Your Pacemaker   Monitor your pacemaker site for redness, swelling, and drainage. Call the device clinic at [phone] if you experience these symptoms or fever/chills.  Your incision was closed with Dermabond:  You may shower 1 day after your defibrillator implant and wash your incision with soap and water. Avoid lotions, ointments, or perfumes over your incision until it is well-healed.  You may use a hot tub or a pool after your wound check appointment if the incision is completely closed.  Do not lift, push or pull greater than 10 pounds with the affected arm until 6 weeks after your procedure. There are no other restrictions in arm movement after your wound check appointment.  You may drive, unless driving has been restricted by your healthcare providers.  Remote monitoring is used to monitor your pacemaker from home. This monitoring is scheduled every 91 days by our office. It allows us to keep an eye on the functioning of your device to ensure it is working properly. You will routinely see your Electrophysiologist annually (more often if necessary).  

## 2022-09-24 NOTE — Progress Notes (Signed)

## 2022-10-08 ENCOUNTER — Other Ambulatory Visit (HOSPITAL_COMMUNITY): Payer: Self-pay

## 2022-12-09 ENCOUNTER — Ambulatory Visit (INDEPENDENT_AMBULATORY_CARE_PROVIDER_SITE_OTHER): Payer: Medicare PPO

## 2022-12-09 DIAGNOSIS — I4430 Unspecified atrioventricular block: Secondary | ICD-10-CM

## 2022-12-09 DIAGNOSIS — Z950 Presence of cardiac pacemaker: Secondary | ICD-10-CM | POA: Insufficient documentation

## 2022-12-09 LAB — CUP PACEART REMOTE DEVICE CHECK
Battery Remaining Longevity: 57 mo
Battery Remaining Percentage: 95.5 %
Battery Voltage: 2.98 V
Brady Statistic AP VP Percent: 1 %
Brady Statistic AP VS Percent: 1 %
Brady Statistic AS VP Percent: 97 %
Brady Statistic AS VS Percent: 2.5 %
Brady Statistic RA Percent Paced: 1 %
Brady Statistic RV Percent Paced: 97 %
Date Time Interrogation Session: 20241114021650
Implantable Lead Connection Status: 753985
Implantable Lead Connection Status: 753985
Implantable Lead Implant Date: 20240814
Implantable Lead Implant Date: 20240814
Implantable Lead Location: 753859
Implantable Lead Location: 753860
Implantable Lead Model: 3830
Implantable Pulse Generator Implant Date: 20240814
Lead Channel Impedance Value: 390 Ohm
Lead Channel Impedance Value: 440 Ohm
Lead Channel Pacing Threshold Amplitude: 0.5 V
Lead Channel Pacing Threshold Amplitude: 0.75 V
Lead Channel Pacing Threshold Pulse Width: 0.5 ms
Lead Channel Pacing Threshold Pulse Width: 0.5 ms
Lead Channel Sensing Intrinsic Amplitude: 12 mV
Lead Channel Sensing Intrinsic Amplitude: 4.1 mV
Lead Channel Setting Pacing Amplitude: 3.5 V
Lead Channel Setting Pacing Amplitude: 3.5 V
Lead Channel Setting Pacing Pulse Width: 0.5 ms
Lead Channel Setting Sensing Sensitivity: 4 mV
Pulse Gen Model: 2272
Pulse Gen Serial Number: 8196925

## 2022-12-21 ENCOUNTER — Encounter: Payer: Self-pay | Admitting: Internal Medicine

## 2022-12-21 ENCOUNTER — Ambulatory Visit: Payer: Medicare PPO | Attending: Internal Medicine | Admitting: Internal Medicine

## 2022-12-21 VITALS — BP 108/70 | HR 104 | Ht 63.0 in | Wt 103.6 lb

## 2022-12-21 DIAGNOSIS — Z79899 Other long term (current) drug therapy: Secondary | ICD-10-CM

## 2022-12-21 DIAGNOSIS — I4430 Unspecified atrioventricular block: Secondary | ICD-10-CM | POA: Diagnosis not present

## 2022-12-21 DIAGNOSIS — Z950 Presence of cardiac pacemaker: Secondary | ICD-10-CM | POA: Diagnosis not present

## 2022-12-21 NOTE — Patient Instructions (Signed)
Medication Instructions:  Your physician recommends that you continue on your current medications as directed. Please refer to the Current Medication list given to you today.  *If you need a refill on your cardiac medications before your next appointment, please call your pharmacy*   Lab Work: CBC with [facility] on the first floor If you have labs (blood work) drawn today and your tests are completely normal, you will receive your results only by: MyChart Message (if you have MyChart) OR A paper copy in the mail If you have any lab test that is abnormal or we need to change your treatment, we will call you to review the results.   Testing/Procedures: None ordered.    Follow-Up: At [facility], you and your health needs are our priority.  As part of our continuing mission to provide you with exceptional heart care, we have created designated Provider Care Teams.  These Care Teams include your primary Cardiologist (physician) and Advanced Practice Providers (APPs -  Physician Assistants and Nurse Practitioners) who all work together to provide you with the care you need, when you need it.  We recommend signing up for the patient portal called "MyChart".  Sign up information is provided on this After Visit Summary.  MyChart is used to connect with patients for Virtual Visits (Telemedicine).  Patients are able to view lab/test results, encounter notes, upcoming appointments, etc.  Non-urgent messages can be sent to your provider as well.   To learn more about what you can do with MyChart, go to [email or web address].    Your next appointment:   9 months with Dr Graciela Husbands

## 2022-12-21 NOTE — Progress Notes (Signed)
        Patient Care Team: Dois Davenport, MD as PCP - General (Family Medicine)   HPI  Holly Farmer is a 77 y.o. 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  She has underlying hypertensive heart disease A.m. episodes of dyspnea and chest discomfort every morning.  Reliving finding her husband's dying 7 years ago.  Denies chest pain or shortness of breath apart from this.    DATE TEST EF   8/24 Echo   60-65 %              Date Cr K Hgb TSH  8/24 1.04 4.1 10.4  2.719             Records and Results Reviewed    Past Medical History:  Diagnosis Date   Anxiety     Past Surgical History:  Procedure Laterality Date   APPENDECTOMY     PACEMAKER IMPLANT N/A 09/08/2022   Procedure: PACEMAKER IMPLANT;  Surgeon: Duke Salvia, MD;  Location: [facility];  Service: Cardiovascular;  Laterality: N/A;    Current Meds  Medication Sig   Acetaminophen (TYLENOL PO) Take by mouth as needed.   escitalopram (LEXAPRO) 10 MG tablet Take 10 mg by mouth daily.   levothyroxine (SYNTHROID) 100 MCG tablet Take 100 mcg by mouth daily.   losartan (COZAAR) 50 MG tablet Take 1 tablet (50 mg total) by mouth daily.   rosuvastatin (CRESTOR) 10 MG tablet Take 1 tablet (10 mg total) by mouth daily.   Current Facility-Administered Medications for the 12/21/22 encounter (Office Visit) with Duke Salvia, MD  Medication   cyanocobalamin ((VITAMIN B-12)) injection 1,000 mcg    No Known Allergies    Review of Systems negative except from HPI and PMH  Physical Exam BP 108/70   Pulse (!) 104   Ht 5\' 3"  (1.6 m)   Wt 103 lb 9.6 oz (47 kg)   SpO2 96%   BMI 18.35 kg/m  Well developed and well nourished in no acute distress HENT normal E scleral and icterus clear Neck Supple Clear to auscultation Device pocket well healed; without hematoma or erythema.  There is no tethering  Regular rate and rhythm, no murmurs gallops or rub Soft  No  clubbing cyanosis  Edema Alert and oriented, grossly normal motor and sensory function Skin Warm and Dry  ECG sinus @ 104 With pseudofusion and device was reprogrammed to increase AV delay to allow for intrinsic conduction   CrCl cannot be calculated (Patient's most recent lab result is older than the maximum 21 days allowed.).   Assessment and  Plan High grade heart block intermittent   HTN   Anxiety  Sinus tachycardia  Pacemaker-Saint Jude  Anemia  Intermittent heart block.  The device has been reprogrammed (As above)  Sinus rates tend to be right shifted.  Will follow.  Thyroid studies normal; hemoglobin was low in August we will check it again.  Encouraged her to think about restoration place given the anxiety is related to the PTSD following her husband's death albeit 7 years ago.  She is here with her granddaughter [name] who has Rett syndrome      Current medicines are reviewed at length with the patient today .  The patient does not  have concerns regarding medicines.

## 2022-12-22 LAB — CBC
Hematocrit: 43.8 % (ref 34.0–46.6)
Hemoglobin: 14.4 g/dL (ref 11.1–15.9)
MCH: 29.2 pg (ref 26.6–33.0)
MCHC: 32.9 g/dL (ref 31.5–35.7)
MCV: 89 fL (ref 79–97)
Platelets: 279 10*3/uL (ref 150–450)
RBC: 4.93 x10E6/uL (ref 3.77–5.28)
RDW: 13.1 % (ref 11.7–15.4)
WBC: 12.8 10*3/uL — ABNORMAL HIGH (ref 3.4–10.8)

## 2022-12-22 NOTE — Progress Notes (Signed)
Remote pacemaker transmission.   

## 2022-12-27 LAB — CUP PACEART INCLINIC DEVICE CHECK
Battery Remaining Longevity: 109 mo
Battery Voltage: 2.98 V
Brady Statistic RA Percent Paced: 0.28 %
Brady Statistic RV Percent Paced: 96 %
Date Time Interrogation Session: 20241126172100
Implantable Lead Connection Status: 753985
Implantable Lead Connection Status: 753985
Implantable Lead Implant Date: 20240814
Implantable Lead Implant Date: 20240814
Implantable Lead Location: 753859
Implantable Lead Location: 753860
Implantable Lead Model: 3830
Implantable Pulse Generator Implant Date: 20240814
Lead Channel Impedance Value: 425 Ohm
Lead Channel Impedance Value: 450 Ohm
Lead Channel Pacing Threshold Amplitude: 1 V
Lead Channel Pacing Threshold Pulse Width: 0.5 ms
Lead Channel Sensing Intrinsic Amplitude: 12 mV
Lead Channel Sensing Intrinsic Amplitude: 4.4 mV
Lead Channel Setting Pacing Amplitude: 1.25 V
Lead Channel Setting Pacing Amplitude: 2 V
Lead Channel Setting Pacing Pulse Width: 0.5 ms
Lead Channel Setting Sensing Sensitivity: 4 mV
Pulse Gen Model: 2272
Pulse Gen Serial Number: 8196925

## 2023-01-17 ENCOUNTER — Telehealth: Payer: Self-pay

## 2023-01-17 NOTE — Telephone Encounter (Signed)
Pt and daughter calling in because they did not receive a phone call in regards to lab work drawn a few weeks ago.  Advised of results.  They are requesting Dr. Graciela Husbands order repeat lab work because Pt is in between PCP's.  Advised would send a message to see if this would be ok.

## 2023-01-21 ENCOUNTER — Telehealth: Payer: Self-pay

## 2023-01-21 DIAGNOSIS — I4430 Unspecified atrioventricular block: Secondary | ICD-10-CM

## 2023-01-21 NOTE — Telephone Encounter (Signed)
Outreach made to Pt's daughter per DPR.  Advised per Dr. Graciela Husbands, ok to order repeat lab work.  Left message requesting call back.

## 2023-01-24 NOTE — Telephone Encounter (Signed)
CBC ordered.   Spoke to pts daughter to advise.

## 2023-02-11 ENCOUNTER — Telehealth: Payer: Self-pay | Admitting: Internal Medicine

## 2023-02-11 NOTE — Telephone Encounter (Signed)
Daughter Holly Farmer) wants a call back directly from [employer] regarding referral.

## 2023-02-11 NOTE — Telephone Encounter (Signed)
Spoke with patient, gave them some names of local primary care providers in the area accepting new patients.   Patient would also like for Dr. Graciela Husbands to make a recommendation as he knows her.  She would like a doctor who has a similar personality as Dr. Graciela Husbands, does he know anyone he would recommend and please call her back.

## 2023-02-14 NOTE — Telephone Encounter (Signed)
I dont know who is accepting new patients but if she finds a few names I will be glad to weigh in on them Thanks SK

## 2023-02-15 NOTE — Telephone Encounter (Signed)
The following doctors have been mentioned/suggested to patient: Any thoughts?   Dr. Darrow Bussing - [facility]  Dr. Abbe Amsterdam - [facility]  Dr. Lucky Cowboy [facility]

## 2023-02-21 ENCOUNTER — Telehealth: Payer: Self-pay | Admitting: Internal Medicine

## 2023-02-21 NOTE — Telephone Encounter (Signed)
Patient called to speak with Dr. Odessa Fleming nurse Boneta Lucks because patient forgot what the doctor's name was that Boneta Lucks referred her too

## 2023-03-10 ENCOUNTER — Ambulatory Visit (INDEPENDENT_AMBULATORY_CARE_PROVIDER_SITE_OTHER): Payer: Medicare PPO

## 2023-03-10 DIAGNOSIS — I4430 Unspecified atrioventricular block: Secondary | ICD-10-CM | POA: Diagnosis not present

## 2023-03-11 LAB — CUP PACEART REMOTE DEVICE CHECK
Battery Remaining Longevity: 118 mo
Battery Remaining Percentage: 95.5 %
Battery Voltage: 3.01 V
Brady Statistic AP VP Percent: 1 %
Brady Statistic AP VS Percent: 1 %
Brady Statistic AS VP Percent: 1 %
Brady Statistic AS VS Percent: 99 %
Brady Statistic RA Percent Paced: 1 %
Brady Statistic RV Percent Paced: 1 %
Date Time Interrogation Session: 20250213020014
Implantable Lead Connection Status: 753985
Implantable Lead Connection Status: 753985
Implantable Lead Implant Date: 20240814
Implantable Lead Implant Date: 20240814
Implantable Lead Location: 753859
Implantable Lead Location: 753860
Implantable Lead Model: 3830
Implantable Pulse Generator Implant Date: 20240814
Lead Channel Impedance Value: 400 Ohm
Lead Channel Impedance Value: 440 Ohm
Lead Channel Pacing Threshold Amplitude: 0.5 V
Lead Channel Pacing Threshold Amplitude: 1.25 V
Lead Channel Pacing Threshold Pulse Width: 0.5 ms
Lead Channel Pacing Threshold Pulse Width: 0.5 ms
Lead Channel Sensing Intrinsic Amplitude: 12 mV
Lead Channel Sensing Intrinsic Amplitude: 4.2 mV
Lead Channel Setting Pacing Amplitude: 1.5 V
Lead Channel Setting Pacing Amplitude: 2 V
Lead Channel Setting Pacing Pulse Width: 0.5 ms
Lead Channel Setting Sensing Sensitivity: 4 mV
Pulse Gen Model: 2272
Pulse Gen Serial Number: 8196925

## 2023-03-15 LAB — CBC
Hematocrit: 41.7 % (ref 34.0–46.6)
Hemoglobin: 13.7 g/dL (ref 11.1–15.9)
MCH: 29.6 pg (ref 26.6–33.0)
MCHC: 32.9 g/dL (ref 31.5–35.7)
MCV: 90 fL (ref 79–97)
Platelets: 251 10*3/uL (ref 150–450)
RBC: 4.63 x10E6/uL (ref 3.77–5.28)
RDW: 13.7 % (ref 11.7–15.4)
WBC: 10.4 10*3/uL (ref 3.4–10.8)

## 2023-03-21 NOTE — Telephone Encounter (Signed)
 Patient is calling due to receiving notification of lab result through mychart, but advised she is unable to access mychart to view them.  Told pt of RN's message stating they were normal per Dr. Graciela Husbands.   Patient verbalized understanding.

## 2023-03-21 NOTE — Telephone Encounter (Signed)
 Attempted phone call to pt to notify pt of normal lab result per Dr Graciela Husbands.  Voicemail message left to contact [facility] at [phone]

## 2023-03-23 NOTE — Telephone Encounter (Signed)
 Attempted phone call to pt and left voicemail message to contact [phone].

## 2023-03-25 NOTE — Telephone Encounter (Signed)
 Attempted phone call to pt and left voicemail to contact [phone].

## 2023-03-25 NOTE — Telephone Encounter (Signed)
 Letter mailed re: normal lab results.  Please see letter for complete details.

## 2023-03-28 ENCOUNTER — Encounter: Payer: Self-pay | Admitting: Internal Medicine

## 2023-04-02 ENCOUNTER — Other Ambulatory Visit: Payer: Self-pay | Admitting: Student

## 2023-04-12 NOTE — Progress Notes (Signed)
 Remote pacemaker transmission.

## 2023-04-12 NOTE — Addendum Note (Signed)
 Addended by: Elease Etienne A on: 04/12/2023 02:51 PM   Modules accepted: Orders

## 2023-06-09 ENCOUNTER — Ambulatory Visit (INDEPENDENT_AMBULATORY_CARE_PROVIDER_SITE_OTHER): Payer: Medicare PPO

## 2023-06-09 DIAGNOSIS — I4430 Unspecified atrioventricular block: Secondary | ICD-10-CM | POA: Diagnosis not present

## 2023-06-09 LAB — CUP PACEART REMOTE DEVICE CHECK
Battery Remaining Longevity: 117 mo
Battery Remaining Percentage: 94 %
Battery Voltage: 3.01 V
Brady Statistic AP VP Percent: 1 %
Brady Statistic AP VS Percent: 1 %
Brady Statistic AS VP Percent: 1 %
Brady Statistic AS VS Percent: 99 %
Brady Statistic RA Percent Paced: 1 %
Brady Statistic RV Percent Paced: 1 %
Date Time Interrogation Session: 20250515020014
Implantable Lead Connection Status: 753985
Implantable Lead Connection Status: 753985
Implantable Lead Implant Date: 20240814
Implantable Lead Implant Date: 20240814
Implantable Lead Location: 753859
Implantable Lead Location: 753860
Implantable Lead Model: 3830
Implantable Pulse Generator Implant Date: 20240814
Lead Channel Impedance Value: 410 Ohm
Lead Channel Impedance Value: 460 Ohm
Lead Channel Pacing Threshold Amplitude: 0.5 V
Lead Channel Pacing Threshold Amplitude: 1 V
Lead Channel Pacing Threshold Pulse Width: 0.5 ms
Lead Channel Pacing Threshold Pulse Width: 0.5 ms
Lead Channel Sensing Intrinsic Amplitude: 12 mV
Lead Channel Sensing Intrinsic Amplitude: 4.4 mV
Lead Channel Setting Pacing Amplitude: 1.25 V
Lead Channel Setting Pacing Amplitude: 2 V
Lead Channel Setting Pacing Pulse Width: 0.5 ms
Lead Channel Setting Sensing Sensitivity: 4 mV
Pulse Gen Model: 2272
Pulse Gen Serial Number: 8196925

## 2023-06-12 ENCOUNTER — Ambulatory Visit: Payer: Self-pay | Admitting: Cardiology

## 2023-07-18 NOTE — Progress Notes (Signed)
 Remote pacemaker transmission.

## 2023-08-31 ENCOUNTER — Telehealth: Payer: Self-pay

## 2023-08-31 NOTE — Telephone Encounter (Signed)
 Patient asked scheduler the following question for device clinic per Angeline Hammer:   pt said, she will be visiting her grand baby today and the child has ambulatory EEG. pt has PPM and she wants to know if its safe for her to hold her grand baby

## 2023-09-01 NOTE — Telephone Encounter (Signed)
 Scheduler informed to advise patient that she can hold her grand baby, but electronic equipment should be at least 6 inches away from device. For extra precaution, patient advised to hold grand baby on opposite side of device.   Informed to contact device clinic for any further questions or concerns.

## 2023-09-08 ENCOUNTER — Ambulatory Visit: Payer: Medicare PPO | Attending: Cardiology

## 2023-09-08 ENCOUNTER — Ambulatory Visit: Payer: Self-pay | Admitting: Cardiology

## 2023-09-08 DIAGNOSIS — I4430 Unspecified atrioventricular block: Secondary | ICD-10-CM | POA: Diagnosis not present

## 2023-09-08 LAB — CUP PACEART REMOTE DEVICE CHECK
Battery Remaining Longevity: 115 mo
Battery Remaining Percentage: 92 %
Battery Voltage: 3.01 V
Brady Statistic AP VP Percent: 1 %
Brady Statistic AP VS Percent: 1 %
Brady Statistic AS VP Percent: 1 %
Brady Statistic AS VS Percent: 99 %
Brady Statistic RA Percent Paced: 1 %
Brady Statistic RV Percent Paced: 1 %
Date Time Interrogation Session: 20250814020015
Implantable Lead Connection Status: 753985
Implantable Lead Connection Status: 753985
Implantable Lead Implant Date: 20240814
Implantable Lead Implant Date: 20240814
Implantable Lead Location: 753859
Implantable Lead Location: 753860
Implantable Lead Model: 3830
Implantable Pulse Generator Implant Date: 20240814
Lead Channel Impedance Value: 400 Ohm
Lead Channel Impedance Value: 450 Ohm
Lead Channel Pacing Threshold Amplitude: 0.5 V
Lead Channel Pacing Threshold Amplitude: 1 V
Lead Channel Pacing Threshold Pulse Width: 0.5 ms
Lead Channel Pacing Threshold Pulse Width: 0.5 ms
Lead Channel Sensing Intrinsic Amplitude: 12 mV
Lead Channel Sensing Intrinsic Amplitude: 4.2 mV
Lead Channel Setting Pacing Amplitude: 1.25 V
Lead Channel Setting Pacing Amplitude: 2 V
Lead Channel Setting Pacing Pulse Width: 0.5 ms
Lead Channel Setting Sensing Sensitivity: 4 mV
Pulse Gen Model: 2272
Pulse Gen Serial Number: 8196925

## 2023-09-14 ENCOUNTER — Encounter: Admitting: Internal Medicine

## 2023-09-14 NOTE — Progress Notes (Unsigned)
  Electrophysiology Office Follow up Visit Note:    Date:  09/16/2023   ID:  Holly, Farmer 05/21/1945, MRN [id number]  PCP:  Burney Darice CROME, MD  [facility] Cardiologist:  None  [facility] Electrophysiologist:  OLE ONEIDA HOLTS, MD    Interval History:     Holly Farmer is a 78 y.o. female who presents for a follow up visit.   The patient was previously followed by Dr. Fernande.  She last saw Dr. Fernande December 21, 2022.  She has high degree AV block with an Abbott pacemaker implanted in 2024.  She is with her daughter today in clinic.  No complaints.      Past medical, surgical, social and family history were reviewed.  ROS:   Please see the history of present illness.    All other systems reviewed and are negative.  EKGs/Labs/Other Studies Reviewed:    The following studies were reviewed today:   September 16, 2023 in clinic device interrogation personally reviewed Battery and lead parameter stable.  No programming changes made today.          Physical Exam:    VS:  BP (!) 152/82   Pulse 81   Ht 5' 3 (1.6 m)   Wt 113 lb (51.3 kg)   SpO2 97%   BMI 20.02 kg/m     Wt Readings from Last 3 Encounters:  09/16/23 113 lb (51.3 kg)  12/21/22 103 lb 9.6 oz (47 kg)  09/06/22 114 lb (51.7 kg)     GEN: no distress CARD: RRR, No MRG.  Pacemaker pocket well-healed.  Some adipose thinning at the suture sleeves.  No adhesions. RESP: No IWOB. CTAB.      ASSESSMENT:    1. AV heart block   2. Pacemaker - St Jude    PLAN:    In order of problems listed above:  #High degree AV block #Permanent pacemaker in situ Device functioning appropriately.  Continue remote monitoring.     Signed, OLE HOLTS, MD, [facility], [facility] 09/16/2023 3:49 PM    Electrophysiology [facility]

## 2023-09-16 ENCOUNTER — Encounter: Payer: Self-pay | Admitting: Cardiology

## 2023-09-16 ENCOUNTER — Ambulatory Visit: Attending: Cardiology | Admitting: Cardiology

## 2023-09-16 VITALS — BP 152/82 | HR 81 | Ht 63.0 in | Wt 113.0 lb

## 2023-09-16 DIAGNOSIS — Z950 Presence of cardiac pacemaker: Secondary | ICD-10-CM | POA: Diagnosis not present

## 2023-09-16 DIAGNOSIS — I4430 Unspecified atrioventricular block: Secondary | ICD-10-CM

## 2023-09-16 NOTE — Patient Instructions (Signed)
 Medication Instructions:  Your physician recommends that you continue on your current medications as directed. Please refer to the Current Medication list given to you today.  *If you need a refill on your cardiac medications before your next appointment, please call your pharmacy*  Lab Work: None ordered.  If you have labs (blood work) drawn today and your tests are completely normal, you will receive your results only by: MyChart Message (if you have MyChart) OR A paper copy in the mail If you have any lab test that is abnormal or we need to change your treatment, we will call you to review the results.  Testing/Procedures: None ordered.   Follow-Up: At [facility], you and your health needs are our priority.  As part of our continuing mission to provide you with exceptional heart care, our providers are all part of one team.  This team includes your primary Cardiologist (physician) and Advanced Practice Providers or APPs (Physician Assistants and Nurse Practitioners) who all work together to provide you with the care you need, when you need it.  Your next appointment:   12 months with Dr Marven Slimmer

## 2023-09-20 LAB — CUP PACEART INCLINIC DEVICE CHECK
Battery Remaining Longevity: 126 mo
Battery Voltage: 3.01 V
Brady Statistic RA Percent Paced: 0.16 %
Brady Statistic RV Percent Paced: 0.03 %
Date Time Interrogation Session: 20250822144400
Implantable Lead Connection Status: 753985
Implantable Lead Connection Status: 753985
Implantable Lead Implant Date: 20240814
Implantable Lead Implant Date: 20240814
Implantable Lead Location: 753859
Implantable Lead Location: 753860
Implantable Lead Model: 3830
Implantable Pulse Generator Implant Date: 20240814
Lead Channel Impedance Value: 412.5 Ohm
Lead Channel Impedance Value: 462.5 Ohm
Lead Channel Pacing Threshold Amplitude: 0.5 V
Lead Channel Pacing Threshold Amplitude: 0.5 V
Lead Channel Pacing Threshold Amplitude: 1 V
Lead Channel Pacing Threshold Amplitude: 1 V
Lead Channel Pacing Threshold Pulse Width: 0.5 ms
Lead Channel Pacing Threshold Pulse Width: 0.5 ms
Lead Channel Pacing Threshold Pulse Width: 0.5 ms
Lead Channel Pacing Threshold Pulse Width: 0.5 ms
Lead Channel Sensing Intrinsic Amplitude: 12 mV
Lead Channel Sensing Intrinsic Amplitude: 4.7 mV
Lead Channel Setting Pacing Amplitude: 1.125
Lead Channel Setting Pacing Amplitude: 2 V
Lead Channel Setting Pacing Pulse Width: 0.5 ms
Lead Channel Setting Sensing Sensitivity: 4 mV
Pulse Gen Model: 2272
Pulse Gen Serial Number: 8196925

## 2023-09-21 ENCOUNTER — Ambulatory Visit: Payer: Self-pay | Admitting: Cardiology

## 2023-10-20 NOTE — Progress Notes (Signed)
 Remote PPM Transmission

## 2023-11-09 ENCOUNTER — Telehealth: Payer: Self-pay | Admitting: Cardiology

## 2023-11-09 ENCOUNTER — Other Ambulatory Visit: Payer: Self-pay

## 2023-11-09 ENCOUNTER — Emergency Department (HOSPITAL_COMMUNITY)
Admission: EM | Admit: 2023-11-09 | Discharge: 2023-11-09 | Disposition: A | Discharge status: Home / Self Care | Attending: Emergency Medicine | Admitting: Emergency Medicine

## 2023-11-09 DIAGNOSIS — F17200 Nicotine dependence, unspecified, uncomplicated: Secondary | ICD-10-CM | POA: Insufficient documentation

## 2023-11-09 DIAGNOSIS — Z79890 Hormone replacement therapy (postmenopausal): Secondary | ICD-10-CM | POA: Diagnosis not present

## 2023-11-09 DIAGNOSIS — E7800 Pure hypercholesterolemia, unspecified: Secondary | ICD-10-CM | POA: Diagnosis not present

## 2023-11-09 DIAGNOSIS — E039 Hypothyroidism, unspecified: Secondary | ICD-10-CM | POA: Diagnosis not present

## 2023-11-09 DIAGNOSIS — R7989 Other specified abnormal findings of blood chemistry: Secondary | ICD-10-CM | POA: Insufficient documentation

## 2023-11-09 DIAGNOSIS — Z950 Presence of cardiac pacemaker: Secondary | ICD-10-CM | POA: Insufficient documentation

## 2023-11-09 DIAGNOSIS — R040 Epistaxis: Secondary | ICD-10-CM | POA: Diagnosis present

## 2023-11-09 DIAGNOSIS — R079 Chest pain, unspecified: Secondary | ICD-10-CM | POA: Diagnosis present

## 2023-11-09 DIAGNOSIS — K921 Melena: Secondary | ICD-10-CM

## 2023-11-09 DIAGNOSIS — R195 Other fecal abnormalities: Secondary | ICD-10-CM | POA: Diagnosis not present

## 2023-11-09 DIAGNOSIS — F411 Generalized anxiety disorder: Secondary | ICD-10-CM | POA: Diagnosis not present

## 2023-11-09 LAB — CBC WITH DIFFERENTIAL/PLATELET
Abs Immature Granulocytes: 0.06 K/uL (ref 0.00–0.07)
Basophils Absolute: 0.1 K/uL (ref 0.0–0.1)
Basophils Relative: 1 %
Eosinophils Absolute: 0.2 K/uL (ref 0.0–0.5)
Eosinophils Relative: 2 %
HCT: 33.7 % — ABNORMAL LOW (ref 36.0–46.0)
Hemoglobin: 10.9 g/dL — ABNORMAL LOW (ref 12.0–15.0)
Immature Granulocytes: 1 %
Lymphocytes Relative: 19 %
Lymphs Abs: 1.9 K/uL (ref 0.7–4.0)
MCH: 29.4 pg (ref 26.0–34.0)
MCHC: 32.3 g/dL (ref 30.0–36.0)
MCV: 90.8 fL (ref 80.0–100.0)
Monocytes Absolute: 0.5 K/uL (ref 0.1–1.0)
Monocytes Relative: 5 %
Neutro Abs: 7.5 K/uL (ref 1.7–7.7)
Neutrophils Relative %: 72 %
Platelets: 201 K/uL (ref 150–400)
RBC: 3.71 MIL/uL — ABNORMAL LOW (ref 3.87–5.11)
RDW: 13.8 % (ref 11.5–15.5)
WBC: 10.3 K/uL (ref 4.0–10.5)
nRBC: 0 % (ref 0.0–0.2)

## 2023-11-09 LAB — COMPREHENSIVE METABOLIC PANEL WITH GFR
ALT: 10 U/L (ref 0–44)
AST: 20 U/L (ref 15–41)
Albumin: 3 g/dL — ABNORMAL LOW (ref 3.5–5.0)
Alkaline Phosphatase: 42 U/L (ref 38–126)
Anion gap: 9 (ref 5–15)
BUN: 43 mg/dL — ABNORMAL HIGH (ref 8–23)
CO2: 23 mmol/L (ref 22–32)
Calcium: 8 mg/dL — ABNORMAL LOW (ref 8.9–10.3)
Chloride: 109 mmol/L (ref 98–111)
Creatinine, Ser: 1.07 mg/dL — ABNORMAL HIGH (ref 0.44–1.00)
GFR, Estimated: 53 mL/min — ABNORMAL LOW (ref >60)
Glucose, Bld: 138 mg/dL — ABNORMAL HIGH (ref 70–99)
Potassium: 4.9 mmol/L (ref 3.5–5.1)
Sodium: 141 mmol/L (ref 135–145)
Total Bilirubin: 0.6 mg/dL (ref 0.0–1.2)
Total Protein: 5.5 g/dL — ABNORMAL LOW (ref 6.5–8.1)

## 2023-11-09 LAB — RESP PANEL BY RT-PCR (RSV, FLU A&B, COVID)  RVPGX2
Influenza A by PCR: NEGATIVE
Influenza B by PCR: NEGATIVE
Resp Syncytial Virus by PCR: NEGATIVE
SARS Coronavirus 2 by RT PCR: NEGATIVE

## 2023-11-09 LAB — TROPONIN I (HIGH SENSITIVITY): Troponin I (High Sensitivity): 11 ng/L (ref <18)

## 2023-11-09 LAB — POC OCCULT BLOOD, ED: Fecal Occult Bld: POSITIVE — AB

## 2023-11-09 MED ORDER — SODIUM CHLORIDE 0.9 % IV BOLUS
500.0000 mL | Freq: Once | INTRAVENOUS | Status: AC
  Administered 2023-11-09: 500 mL via INTRAVENOUS

## 2023-11-09 NOTE — Discharge Instructions (Addendum)
 It was a pleasure taking care of you today.  Based on your history, physical exam and labs I feel you are safe for discharge.  Today your nose did not bleed while you were in the emergency department.  If it does after discharge recommend that you tilt your head forward and apply pressure, you may use Afrin nasal spray to try and stop the bleeding. Please continue to apply pressure, if this does not work you may have to return to the ED or seek further medical care. Please avoid trauma/injury to your nose. As far as the blood in your stool, I am suspicious that this is from the blood you ingested from the nosebleed. Please follow-up with your primary care provider to make sure this resolves and for a re-check of your hemoglobin on Monday.  I have also attached the information for ENT, please call make an appointment if your nosebleeds persist.  If symptoms persist or worsen recommend follow-up within 48 hours.  If you experience any of the following symptoms including but not limited to fever, chills, chest pain, shortness of breath, unexplained weakness, worsening blood in stool, unilateral weakness, visual disturbances, facial droop, slurred speech, uncontrollable nosebleed, trauma/injury, or other concerning symptom please return to the emergency department or seek further medical care.

## 2023-11-09 NOTE — Telephone Encounter (Signed)
 Son-in-law called saying the ambulance came and is taking pt to [facility] ER right now. She was having nose bleeds, slurring her speech then she passed out. He wanted to make us  aware.

## 2023-11-09 NOTE — ED Provider Notes (Signed)
 [place] EMERGENCY DEPARTMENT AT [facility] Provider Note   CSN: [id number] Arrival date & time: 11/09/23  1256     Patient presents with: Epistaxis   Holly Farmer is a 78 y.o. female presents to the emergency department with a chief complaint of episodic uncontrollable nosebleeds as well as what sounds like a presyncopal episode.  Patient states that she has been congested over the past few days and that yesterday morning she had some episodes of sneezing.  She states then that last night her nose began to bleed uncontrollably.  She states that when this happened the first time it was so severe that her family and herself decided to call EMS, by the time EMS had arrived the nosebleed has stopped on its own.  She was evaluated by EMS and felt to be stable at this time.  This then happened once again a few hours later, EMS was called once again and again the nosebleed had largely subsided by the time of their arrival.  Today patient was walking to get in the car to go to her doctor's appointment whenever her nose once again began to bleed profusely, family state that the patient's nose was pouring blood for approximately an hour and a half and that the nosebleed was uncontrollable.  At time of arrival to the emergency department patient's nosebleed has been controlled with pressure.  EMS states that upon their arrival patient was pale and diaphoretic, blood pressure was noted to be 72/46 which improved to 118/58 with a 400 mL bolus of fluids. Denies fever, chills, chest pain, shortness of breath, abdominal pain.  Patient does appreciate 1 episode of vomiting but believes this is due to the amount of blood going down the back of her throat, states that she did vomit up some of the bright red blood from her nosebleed.  Family also mentions that after severe nosebleed episode today that the patient became very pale and diaphoretic.  Denies blood thinning medication.  Denies trauma/injury.   Past medical history significant for generalized anxiety disorder, hypercholesterolemia, hypothyroidism, tobacco abuse, pacemaker.  Of note family at bedside states that patient has been hiccuping for the past few days.    Epistaxis      Prior to Admission medications   Medication Sig Start Date End Date Taking? Authorizing Provider  Acetaminophen  (TYLENOL  PO) Take by mouth as needed.    [provider]  escitalopram (LEXAPRO) 10 MG tablet Take 10 mg by mouth daily. 08/23/22   [provider]  levothyroxine  (SYNTHROID ) 100 MCG tablet Take 100 mcg by mouth daily. 07/10/22   [provider]  losartan  (COZAAR ) 50 MG tablet TAKE 1 TABLET BY MOUTH DAILY 04/05/23   Fernande Elspeth BROCKS, MD  rosuvastatin  (CRESTOR ) 10 MG tablet Take 1 tablet (10 mg total) by mouth daily. 01/26/17   Smith, Kristi M, MD    Allergies: Patient has no known allergies.    Review of Systems  HENT:  Positive for nosebleeds.     Updated Vital Signs BP 127/67   Pulse 80   Temp 98 F (36.7 C) (Oral)   Resp 20   SpO2 100%   Physical Exam Vitals and nursing note reviewed.  Constitutional:      General: She is awake. She is not in acute distress.    Appearance: Normal appearance. She is not ill-appearing, toxic-appearing or diaphoretic.  HENT:     Head: Normocephalic and atraumatic.     Nose:  Comments: Small amount of dried bright red blood present to left nasal vault, no appreciable active bleeding, no areas that need cauterization, no open wounds, no signs of trauma/injury    Mouth/Throat:     Mouth: Mucous membranes are moist.     Pharynx: No oropharyngeal exudate or posterior oropharyngeal erythema.  Eyes:     General: No visual field deficit or scleral icterus.       Right eye: No discharge.        Left eye: No discharge.     Extraocular Movements: Extraocular movements intact.     Conjunctiva/sclera: Conjunctivae normal.     Pupils: Pupils are equal, round, and reactive to  light.  Cardiovascular:     Rate and Rhythm: Normal rate and regular rhythm.  Pulmonary:     Effort: Pulmonary effort is normal. No respiratory distress.     Breath sounds: Normal breath sounds. No stridor. No wheezing, rhonchi or rales.  Abdominal:     General: Abdomen is flat.     Palpations: Abdomen is soft.     Tenderness: There is no abdominal tenderness. There is no right CVA tenderness, left CVA tenderness, guarding or rebound.  Musculoskeletal:        General: Normal range of motion.     Right lower leg: No edema.     Left lower leg: No edema.  Skin:    General: Skin is warm.     Capillary Refill: Capillary refill takes less than 2 seconds.  Neurological:     General: No focal deficit present.     Mental Status: She is alert and oriented to person, place, and time.     GCS: GCS eye subscore is 4. GCS verbal subscore is 5. GCS motor subscore is 6.     Cranial Nerves: Cranial nerves 2-12 are intact. No cranial nerve deficit, dysarthria or facial asymmetry.     Sensory: Sensation is intact. No sensory deficit.     Motor: Motor function is intact. No weakness.     Coordination: Coordination is intact. Coordination normal.     Gait: Gait is intact. Gait normal.  Psychiatric:        Mood and Affect: Mood normal.        Behavior: Behavior normal. Behavior is cooperative.     (all labs ordered are listed, but only abnormal results are displayed) Labs Reviewed  CBC WITH DIFFERENTIAL/PLATELET - Abnormal; Notable for the following components:      Result Value   RBC 3.71 (*)    Hemoglobin 10.9 (*)    HCT 33.7 (*)    All other components within normal limits  COMPREHENSIVE METABOLIC PANEL WITH GFR - Abnormal; Notable for the following components:   Glucose, Bld 138 (*)    BUN 43 (*)    Creatinine, Ser 1.07 (*)    Calcium  8.0 (*)    Total Protein 5.5 (*)    Albumin 3.0 (*)    GFR, Estimated 53 (*)    All other components within normal limits  POC OCCULT BLOOD, ED -  Abnormal; Notable for the following components:   Fecal Occult Bld POSITIVE (*)    All other components within normal limits  RESP PANEL BY RT-PCR (RSV, FLU A&B, COVID)  RVPGX2  TROPONIN I (HIGH SENSITIVITY)    EKG: None EKG interpreted by myself, no acute signs of ST segment elevation or acute ischemic changes.  Paced sinus rhythm.  Radiology: No results found.   Procedures   Medications  Ordered in the ED  sodium chloride  0.9 % bolus 500 mL (500 mLs Intravenous New Bag/Given 11/09/23 1638)                                    Medical Decision Making Amount and/or Complexity of Data Reviewed Labs: ordered.   Patient presents to the ED for concern of nosebleed, dizziness, this involves an extensive number of treatment options, and is a complaint that carries with it a high risk of complications and morbidity.  The differential diagnosis includes anterior nosebleed, posterior nosebleed, GI bleed, ACS, presyncopal episode, dehydration, stroke, etc.   Co morbidities that complicate the patient evaluation  generalized anxiety disorder, hypercholesterolemia, hypothyroidism, tobacco abuse, pacemaker   Additional history obtained:  Family who is at bedside as well as EMS who noted hypotension upon their first arrival with improvement after small bolus of fluids   Lab Tests:  I Ordered, and personally interpreted labs.  The pertinent results include: CBC significant for hemoglobin of 10.9 which appears decreased from the last 2 lab samples, CMP consistent with previous specimens however BUN mildly elevated at 43, troponin 11, occult blood positive, respiratory panel pending  Cardiac Monitoring:  The patient was maintained on a cardiac monitor.  I personally viewed and interpreted the cardiac monitored which showed an underlying rhythm of: Sinus rhythm   Medicines ordered and prescription drug management:  I have reviewed the patients home medicines and have made adjustments  as needed   Test Considered:  CT abdomen pelvis with contrast: Declined at this time as patient denies any abdominal pain, CMP consistent with previous baseline, low clinical suspicion for acute life-threatening GI bleed   Critical Interventions:  None   Problem List / ED Course:  78 year old female, vital signs now stable after fluid bolus by EMS presents emergency department with chief complaint of epistaxis, hypotension, dizziness On physical exam patient very well-appearing, no longer pale or diaphoretic, no abdominal tenderness or guarding, neurologic exam without abnormality Dried blood present to left nasal vault with no sign of active bleeding, I do not believe patient would benefit from cauterization at this time as there does not appear to be an active bleed, low clinical suspicion for posterior nosebleed since nosebleed seems to repeatedly clot off by itself with pressure, patient is on a blood thinning medication General Labs show hemoglobin of 10.9 which does seem slightly decreased from patient baseline in the last few months, CMP consistent with previous, troponin 11 While in the emergency department patient got up out of bed and went to go use the restroom, black tarry stool was noted, I tested the specimen which was Hemoccult positive, I believe this is due to the amount of blood that was ingested by the patient during the nosebleed per family, again patient abdomen reevaluated and still no abdominal tenderness, patient vital signs have remained stable throughout her time in the emergency department Patient signed out to oncoming provider Rocky Hamilton PA-C who assumes care over this patient, further work-up and disposition.  Currently respiratory panel is pending and reevaluation after small bolus of fluids.  Plan at this time is for reassessment and discharge if nosebleed continues to not occur. Patient instructed to follow-up with PCP on Monday for hemoglobin re-check and  reassessment of blood in stool At this time I think most likely diagnosis is hypotension due to dehydration/loss of volume due to decreased diet as well as  blood loss, low clinical suspicion for ACS based off of history, low clinical suspicion for stroke based on history and non-focal neuro exam, low medical suspicion for acute GI bleed based on lack of abdominal tenderness and stable vitals   Reevaluation:  After the interventions noted above, I reevaluated the patient and found that they have :stayed the same   Social Determinants of Health:  Tobacco abuse   Dispostion:  Patient signed out to oncoming provider Rocky Hamilton PA-C who assumes care over this patient, further work-up and disposition.  Currently respiratory panel is pending and reevaluation after small bolus of fluids.  Plan at this time is for reassessment and discharge if nosebleed continues to not occur. Patient instructed to follow-up with PCP on Monday for hemoglobin re-check and reassessment of blood in stool     Final diagnoses:  Epistaxis  Blood in stool    ED Discharge Orders     None          Janetta Terrall FALCON, PA-C 11/09/23 1652    Elnor Savant A, DO 11/10/23 1017

## 2023-11-09 NOTE — ED Triage Notes (Signed)
 PT to ED from home with co nose bleed that started yesterday. EMS had been out twice to evaluate her. PT was on her way to a doctors appointment and started feeling weak.  Her nose began to bleed again and she felt dizzy bp was 72/46. PT did not pass out. PT denies any blood thinners. PT did vomit per ems. PT has a pacemaker. PT received 400 of NS from ems and arrived with an 18 G in left hand.

## 2023-11-09 NOTE — ED Notes (Signed)
Got patient undressed into a gown on the monitor got patient a warm blanket patient is resting with call bell in reach  

## 2023-11-09 NOTE — ED Notes (Signed)
 PT discharged to home.  Breathing is even and unlabored.  Skin warm and dry.  Denies any needs or further questions. Will follow up as directed.

## 2023-11-09 NOTE — ED Notes (Signed)
 PA at bedside.

## 2023-11-09 NOTE — ED Provider Notes (Signed)
  Physical Exam   Vitals:   11/09/23 1309 11/09/23 1445 11/09/23 1545 11/09/23 1725  BP: 135/62 (!) 122/57 127/67   Pulse: 94 81 80   Resp: 18 (!) 21 20   Temp: 98 F (36.7 C)   98.1 F (36.7 C)  TempSrc: Oral   Oral  SpO2: 99%  100%      Physical Exam  Procedures  Procedures  ED Course / MDM    Medical Decision Making Amount and/or Complexity of Data Reviewed Labs: ordered.   Patient received at shift change from prior EDP Collin Hinnant PA-C, see their note for initial history, physical exam findings, lab interpretations, and initial assessment/plan.  Patient has had recurrent nosebleeds since yesterday, she is not on blood thinners.  Patient called EMS twice yesterday, however her nosebleed resolved on its own prior to EMS arrival and she was not transported to the hospital.  She had a nosebleed today that her family states lasted approximately 1.5 hours, when transporting patient to the hospital she became presyncopal but did not have a syncopal event.  She has not had a nosebleed since presenting to the emergency department today.  Her family is concerned for ACS as she has had hiccups for 3 days, she has not had chest pain or shortness of breath.  Troponin pending at time of shift change.  Patient does have drop in her hemoglobin to 10.9 with most recent baseline of 13.7, however patient's hemoglobin has been <10.9 previously this year. These findings do not necessitate blood transfusion, she is hemodynamically stable.   Unfortunately at ~3:50 patient did have an episode of black tarry stool, she was evaluated at the bedside by [facility]. Hemoccult positive. This is likely due to blood that drained down her posterior oropharynx from recurrent nosebleeds since yesterday.  COVID/Flu/RSV negative  At time of my reassessment, patient has not had a recurrence of her nosebleed since presenting to the emergency department today.  She is hemodynamically stable and has  received half a liter of fluids for rehydration.  I discussed management strategies for treatment of epistaxis, including applying pressure/leaning forward, use of Afrin/oxymetazoline, and use of a humidifier in her home to hopefully prevent worsening drying in her naris.  Return precautions were discussed.  She voiced understanding and is in agreement this plan, she is appropriate for discharge at this time.       Glendia Rocky SAILOR, [place] 11/09/23 1741    Francesca Elsie CROME, MD 11/11/23 1902

## 2023-11-10 ENCOUNTER — Institutional Professional Consult (permissible substitution) (INDEPENDENT_AMBULATORY_CARE_PROVIDER_SITE_OTHER): Admitting: Physician Assistant

## 2023-11-11 ENCOUNTER — Ambulatory Visit (INDEPENDENT_AMBULATORY_CARE_PROVIDER_SITE_OTHER): Admitting: Physician Assistant

## 2023-11-11 ENCOUNTER — Encounter (INDEPENDENT_AMBULATORY_CARE_PROVIDER_SITE_OTHER): Payer: Self-pay | Admitting: Physician Assistant

## 2023-11-11 VITALS — BP 119/67 | HR 85

## 2023-11-11 DIAGNOSIS — R040 Epistaxis: Secondary | ICD-10-CM | POA: Diagnosis not present

## 2023-11-11 NOTE — Progress Notes (Signed)
 Dear Dr. Burney, Here is my assessment for our mutual patient, Holly Farmer. Thank you for allowing me the opportunity to care for your patient. Please do not hesitate to contact me should you have any other questions. Sincerely, Chyrl Cohen PA-C  Otolaryngology Clinic Note Referring provider: Dr. Burney HPI:  Holly Farmer is a 78 y.o. female kindly referred by Dr. Burney   The patient is a 78 year old female seen in our office for evaluation of epistaxis.  The patient is accompanied by her daughter today.  She notes that on 11/09/2023 she had an episode of left-sided epistaxis.  She notes that this resolved, she had a repeat with uncontrolled bleeding.  She called EMS, this was resolved by the time EMS got there.  She ended up following up in the emergency room as she had a presyncopal episode.  Upon evaluation in the ER she had dried blood in the left nare, no obvious source.  Her hemoglobin was 10.9.  Her symptoms were improved and she was discharged home for follow-up evaluation.  She denies any preceding history of same, no trauma to the nose.  She notes she has been taking some ibuprofen but denies any antiplatelets or anticoagulants.  She does not drink alcohol.  No bleeding disorders.     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   Procedure: PACEMAKER IMPLANT;  Surgeon: Fernande Elspeth BROCKS, MD;  Location: [facility] INVASIVE CV LAB;  Service: Cardiovascular;  Laterality: N/A;    Family History  Problem Relation Age of Onset   Atrial fibrillation Mother    Arthritis Mother      Social Connections: Not on file      Current Outpatient Medications:    Acetaminophen  (TYLENOL  PO), Take by mouth as needed., Disp: , Rfl:    escitalopram (LEXAPRO) 10 MG  tablet, Take 10 mg by mouth daily., Disp: , Rfl:    levothyroxine  (SYNTHROID ) 100 MCG tablet, Take 100 mcg by mouth daily., Disp: , Rfl:    loratadine (CLARITIN) 5 MG chewable tablet, Chew 5 mg by mouth daily., Disp: , Rfl:    losartan  (COZAAR ) 50 MG tablet, TAKE 1 TABLET BY MOUTH DAILY, Disp: 90 tablet, Rfl: 2   oxymetazoline (AFRIN) 0.05 % nasal spray, Place 1 spray into both nostrils as needed for congestion (nose bleeds)., Disp: , Rfl:    rosuvastatin  (CRESTOR ) 10 MG tablet, Take 1 tablet (10 mg total) by mouth daily., Disp: 90 tablet, Rfl: 1  Current Facility-Administered Medications:    cyanocobalamin  ((VITAMIN B-12)) injection 1,000 mcg, 1,000 mcg, Intramuscular, Q30 days, Claudene Rayfield HERO, MD, 1,000 mcg at 11/06/16 1258   Physical Exam:   There were no vitals taken for this visit.  Pertinent Findings  CN II-XII intact Anterior rhinoscopy: Septum left deviation; bilateral inferior turbinates with moderate hypertrophy, no obvious lesions, dried blood in the left nare No lesions of oral cavity/oropharynx; dentition with normal limits No obviously palpable neck masses/lymphadenopathy/thyromegaly No respiratory distress or stridor  Seprately Identifiable Procedures:  Procedure Note Pre-procedure diagnosis: Epistaxis Post-procedure diagnosis: Same Procedure: Transnasal Fiberoptic Laryngoscopy, CPT 31575 - Mod 25 Indication: see above Complications: None apparent EBL: 0 mL   The procedure was undertaken to further evaluate the patient's complaint of Stacks is, with mirror exam  inadequate for appropriate examination due to gag reflex and poor patient tolerance   Procedure:  Patient was identified as correct patient. Verbal consent was obtained. The nose was sprayed with oxymetazoline and 4% lidocaine . The The flexible laryngoscope was passed through the nose to view the nasal cavity, pharynx (oropharynx, hypopharynx) The patient tolerated the procedure well.   Findings: The nasal  cavity and nasopharynx did not reveal any masses or lesions, mucosa appeared to be without obvious lesions.  There was a left-sided septal deviation with mucosal irritation and very minimal bleeding, no obvious blood vessels.  T Impression & Plans:  Holly Farmer is a 78 y.o. female with the following   Epistaxis-  Left-sided epistaxis.  I was able to visualize the area of concern along the left septum posterior to a septal deviation, there does not appear to be any superficial blood vessels appears to be an area of irritation, no suspicious lesions.  Very minimal blood at the site.  The remainder of the exam was benign with no other concerning findings.  I would recommend saline sprays throughout the day, Vaseline at the nostril, humidification at night.  If she does develop a bleed I would like her to use Afrin soaked gauze and direct pressure for 20 minutes, if she has persistent bleed follow-up in the emergency room immediately.  I would like to see her back in the office in 4 weeks for repeat evaluation or sooner as needed.  The patient and her daughter verbalized understanding and agreement to today's plan had no further questions or concerns.   - f/u 4 weeks in the office   Thank you for allowing me the opportunity to care for your patient. Please do not hesitate to contact me should you have any other questions.  Sincerely, Chyrl Cohen PA-C [facility] Phone: [phone] Fax: [phone]  11/11/2023, 11:24 AM

## 2023-12-08 ENCOUNTER — Ambulatory Visit: Payer: Medicare PPO

## 2023-12-08 DIAGNOSIS — I4430 Unspecified atrioventricular block: Secondary | ICD-10-CM

## 2023-12-08 LAB — CUP PACEART REMOTE DEVICE CHECK
Battery Remaining Longevity: 111 mo
Battery Remaining Percentage: 90 %
Battery Voltage: 3.01 V
Brady Statistic AP VP Percent: 1 %
Brady Statistic AP VS Percent: 1 %
Brady Statistic AS VP Percent: 1 %
Brady Statistic AS VS Percent: 99 %
Brady Statistic RA Percent Paced: 1 %
Brady Statistic RV Percent Paced: 1 %
Date Time Interrogation Session: 20251113020013
Implantable Lead Connection Status: 753985
Implantable Lead Connection Status: 753985
Implantable Lead Implant Date: 20240814
Implantable Lead Implant Date: 20240814
Implantable Lead Location: 753859
Implantable Lead Location: 753860
Implantable Lead Model: 3830
Implantable Pulse Generator Implant Date: 20240814
Lead Channel Impedance Value: 410 Ohm
Lead Channel Impedance Value: 460 Ohm
Lead Channel Pacing Threshold Amplitude: 0.5 V
Lead Channel Pacing Threshold Amplitude: 1.25 V
Lead Channel Pacing Threshold Pulse Width: 0.5 ms
Lead Channel Pacing Threshold Pulse Width: 0.5 ms
Lead Channel Sensing Intrinsic Amplitude: 12 mV
Lead Channel Sensing Intrinsic Amplitude: 5 mV
Lead Channel Setting Pacing Amplitude: 1.5 V
Lead Channel Setting Pacing Amplitude: 2 V
Lead Channel Setting Pacing Pulse Width: 0.5 ms
Lead Channel Setting Sensing Sensitivity: 4 mV
Pulse Gen Model: 2272
Pulse Gen Serial Number: 8196925

## 2023-12-09 ENCOUNTER — Encounter (INDEPENDENT_AMBULATORY_CARE_PROVIDER_SITE_OTHER): Payer: Self-pay | Admitting: Physician Assistant

## 2023-12-09 ENCOUNTER — Ambulatory Visit (INDEPENDENT_AMBULATORY_CARE_PROVIDER_SITE_OTHER): Admitting: Physician Assistant

## 2023-12-09 VITALS — BP 135/76 | HR 98

## 2023-12-09 DIAGNOSIS — Z09 Encounter for follow-up examination after completed treatment for conditions other than malignant neoplasm: Secondary | ICD-10-CM | POA: Diagnosis not present

## 2023-12-09 DIAGNOSIS — R040 Epistaxis: Secondary | ICD-10-CM

## 2023-12-09 NOTE — Progress Notes (Signed)
 Dear Dr. Burney, Here is my assessment for our mutual patient, Holly Farmer. Thank you for allowing me the opportunity to care for your patient. Please do not hesitate to contact me should you have any other questions. Sincerely, Chyrl Cohen PA-C  Otolaryngology Clinic Note Referring provider: Dr. Burney HPI:  Holly Farmer is a 78 y.o. female kindly referred by Dr. Burney   Discussed the use of AI scribe software for clinical note transcription with the patient, who gave verbal consent to proceed.  She was last seen in the office on 11/11/2023 for epistaxis.  She notes since that time she has had no recurrence of epistaxis.  She has not been using saline in the nostrils but has been using the Vaseline.  No pain in the nose no other complaints here today.       Independent Review of Additional Tests or Records:  None   PMH/Meds/All/SocHx/FamHx/ROS:   Past Medical History:  Diagnosis Date   Anxiety      Past Surgical History:  Procedure Laterality Date   APPENDECTOMY     PACEMAKER IMPLANT N/A 09/08/2022   Procedure: PACEMAKER IMPLANT;  Surgeon: Fernande Elspeth BROCKS, MD;  Location: [facility] INVASIVE CV LAB;  Service: Cardiovascular;  Laterality: N/A;    Family History  Problem Relation Age of Onset   Atrial fibrillation Mother    Arthritis Mother      Social Connections: Not on file      Current Outpatient Medications:    Acetaminophen  (TYLENOL  PO), Take by mouth as needed., Disp: , Rfl:    escitalopram (LEXAPRO) 10 MG tablet, Take 10 mg by mouth daily., Disp: , Rfl:    levothyroxine  (SYNTHROID ) 100 MCG tablet, Take 100 mcg by mouth daily., Disp: , Rfl:    loratadine (CLARITIN) 5 MG chewable tablet, Chew 5 mg by mouth daily., Disp: , Rfl:    losartan  (COZAAR ) 50 MG tablet, TAKE 1 TABLET BY MOUTH DAILY, Disp: 90 tablet, Rfl: 2   oxymetazoline (AFRIN) 0.05 % nasal spray, Place 1 spray into both nostrils as needed for congestion (nose bleeds)., Disp: , Rfl:    rosuvastatin   (CRESTOR ) 10 MG tablet, Take 1 tablet (10 mg total) by mouth daily., Disp: 90 tablet, Rfl: 1  Current Facility-Administered Medications:    cyanocobalamin  ((VITAMIN B-12)) injection 1,000 mcg, 1,000 mcg, Intramuscular, Q30 days, Claudene Rayfield HERO, MD, 1,000 mcg at 11/06/16 1258   Physical Exam:   BP 135/76   Pulse 98   SpO2 93%   Pertinent Findings  CN II-XII grossly intact Bilateral EAC clear and TM intact with well pneumatized middle ear spaces Anterior rhinoscopy: Septum left deviation; left nose with dried secretions, no epistaxis, no superficial blood vessels or signs of previous bleed No lesions of oral cavity/oropharynx;  No obviously palpable neck masses/lymphadenopathy/thyromegaly No respiratory distress or stridor       Seprately Identifiable Procedures:  None  Impression & Plans:  Holly Farmer is a 78 y.o. female with the following   Assessment and Plan    Epistaxis - No recent epistaxis reported. - Continue saline sprays to moisturize nasal passages. - Continue applying Vaseline to nasal passages. - Return for evaluation if epistaxis recurs.           - f/u PRN   Thank you for allowing me the opportunity to care for your patient. Please do not hesitate to contact me should you have any other questions.  Sincerely, Chyrl Cohen PA-C [facility] Phone: [phone] Fax: [phone]  12/09/2023, 3:01 PM

## 2023-12-09 NOTE — Progress Notes (Signed)
 Patient is having BP managed.

## 2023-12-13 ENCOUNTER — Ambulatory Visit: Payer: Self-pay | Admitting: Cardiology

## 2023-12-13 NOTE — Progress Notes (Signed)
 Remote PPM Transmission

## 2023-12-30 ENCOUNTER — Other Ambulatory Visit: Payer: Self-pay | Admitting: Cardiology

## 2024-01-04 MED ORDER — LOSARTAN POTASSIUM 50 MG PO TABS: 50.0000 mg | ORAL_TABLET | Freq: Every day | ORAL | 2 refills | Status: AC

## 2024-02-17 ENCOUNTER — Telehealth: Payer: Self-pay | Admitting: Cardiology

## 2024-02-17 NOTE — Telephone Encounter (Signed)
 Pt c/o BP issue: STAT if pt c/o blurred vision, one-sided weakness or slurred speech.  STAT if BP is GREATER than 180/120 TODAY.  STAT if BP is LESS than 90/60 and SYMPTOMATIC TODAY  1. What is your BP concern? BP  2. Have you taken any BP medication today?No   3. What are your last 5 BP readings? 141/78, 150/85, 140/70  4. Are you having any other symptoms (ex. Dizziness, headache, blurred vision, passed out)? No

## 2024-02-17 NOTE — Telephone Encounter (Signed)
 Spoke with the patient who gave verbal permission to speak with her daughter. Called daughter back in regards to patient's blood pressure. She reports recent readings: 141/78, 150/85, 140/70. Patient is asymptomatic. She does not know the patient's medications but states that they should all be correct in her chart. Her list has losartan  50 mg daily. Advised that the patient should keep a log of her blood pressures and bring to her PCP. Patient has an appointment with PCP on 2/10. Advised that we could get her in with general cardiology if her PCP does not want to manage her BP. Daughter verbalized understanding.
